# Patient Record
Sex: Female | Born: 1955 | Race: White | Hispanic: No | Marital: Married | Smoking: Former smoker
Health system: Southern US, Community
[De-identification: ages and names within clinical notes are randomized; demographics above are authoritative.]

## PROBLEM LIST (undated history)

## (undated) DIAGNOSIS — E782 Mixed hyperlipidemia: Secondary | ICD-10-CM

## (undated) DIAGNOSIS — R112 Nausea with vomiting, unspecified: Secondary | ICD-10-CM

## (undated) DIAGNOSIS — Z9889 Other specified postprocedural states: Secondary | ICD-10-CM

## (undated) HISTORY — PX: COSMETIC SURGERY: SHX468

## (undated) HISTORY — PX: MOUTH SURGERY: SHX715

---

## 1979-11-29 HISTORY — PX: SHOULDER SURGERY: SHX246

## 2005-10-27 ENCOUNTER — Ambulatory Visit: Payer: Self-pay | Admitting: General Practice

## 2005-11-03 ENCOUNTER — Ambulatory Visit: Payer: Self-pay | Admitting: Family Medicine

## 2006-06-21 ENCOUNTER — Ambulatory Visit: Payer: Self-pay | Admitting: Family Medicine

## 2006-07-06 ENCOUNTER — Ambulatory Visit: Payer: Self-pay | Admitting: Family Medicine

## 2008-09-15 ENCOUNTER — Ambulatory Visit: Payer: Self-pay | Admitting: Family Medicine

## 2008-10-10 ENCOUNTER — Ambulatory Visit: Payer: Self-pay | Admitting: Family Medicine

## 2009-03-06 ENCOUNTER — Ambulatory Visit: Payer: Self-pay | Admitting: Diagnostic Radiology

## 2009-03-09 ENCOUNTER — Ambulatory Visit: Payer: Self-pay | Admitting: Unknown Physician Specialty

## 2010-03-22 ENCOUNTER — Ambulatory Visit: Payer: Self-pay | Admitting: Unknown Physician Specialty

## 2011-02-02 ENCOUNTER — Ambulatory Visit: Payer: Self-pay | Admitting: Family Medicine

## 2013-02-19 ENCOUNTER — Ambulatory Visit: Payer: Self-pay | Admitting: Medical

## 2013-02-19 LAB — HM MAMMOGRAPHY

## 2013-02-21 ENCOUNTER — Ambulatory Visit: Payer: Self-pay | Admitting: Medical

## 2013-05-03 LAB — HM PAP SMEAR: HM Pap smear: NEGATIVE

## 2013-05-06 LAB — LIPID PANEL
CHOLESTEROL: 198 mg/dL (ref 0–200)
HDL: 55 mg/dL (ref 35–70)
LDL CALC: 132 mg/dL
TRIGLYCERIDES: 54 mg/dL (ref 40–160)

## 2013-05-06 LAB — TSH: TSH: 2.35 u[IU]/mL (ref 0.41–5.90)

## 2013-05-06 LAB — BASIC METABOLIC PANEL
BUN: 16 mg/dL (ref 4–21)
Creatinine: 0.8 mg/dL (ref 0.5–1.1)
Glucose: 91 mg/dL
Potassium: 4.9 mmol/L (ref 3.4–5.3)
SODIUM: 146 mmol/L (ref 137–147)

## 2013-05-06 LAB — CBC AND DIFFERENTIAL
HEMATOCRIT: 37 % (ref 36–46)
HEMOGLOBIN: 12.4 g/dL (ref 12.0–16.0)
PLATELETS: 272 10*3/uL (ref 150–399)
WBC: 3.8 10*3/mL

## 2013-05-06 LAB — HEPATIC FUNCTION PANEL
ALT: 18 U/L (ref 7–35)
AST: 23 U/L (ref 13–35)

## 2013-05-22 ENCOUNTER — Ambulatory Visit: Payer: Self-pay | Admitting: Family Medicine

## 2014-06-06 IMAGING — MG MAM DGT SCR W/CAD CORP NO ORDER
2 series · 5 of 5 positions shown · non-contrast
Comparison: none

REASON FOR EXAM: corp scr mammo no order
COMMENTS:

PROCEDURE:     MAM - MAM DGT SCR W/CAD CORP NO ORDER  - February 19, 2013  [DATE]
RESULT:     Bilateral digital screening mammogram with CAD dated 02/19/2013
Comparison study/studies dated: 02/02/2011, [DATE] [DATE], 06/21/2006

[R CC · right · 4 of 4 slices shown]
[im 1/4]
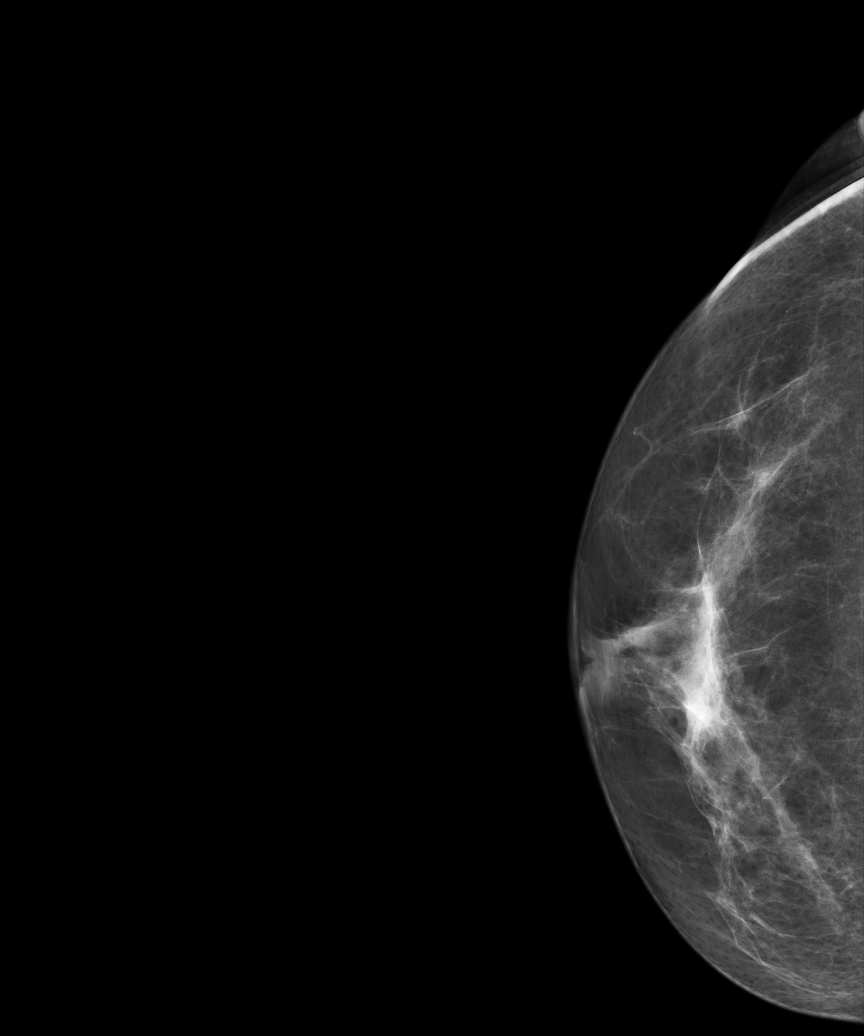
[im 2/4]
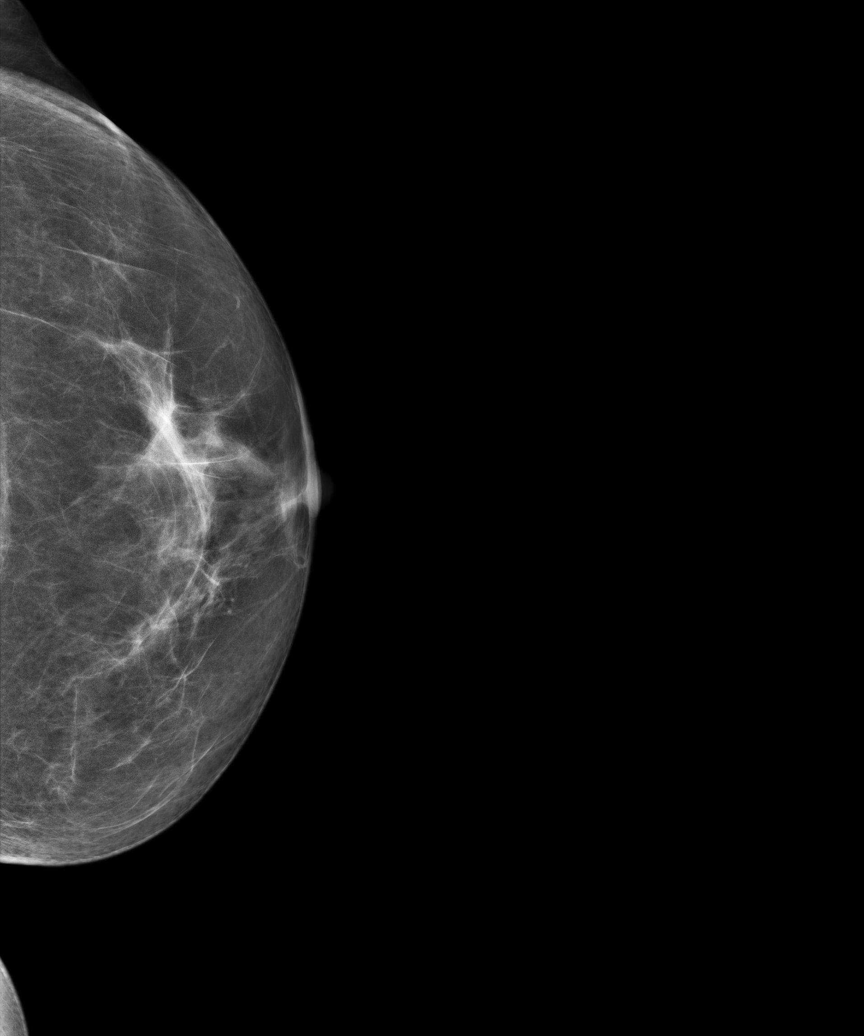
[im 3/4]
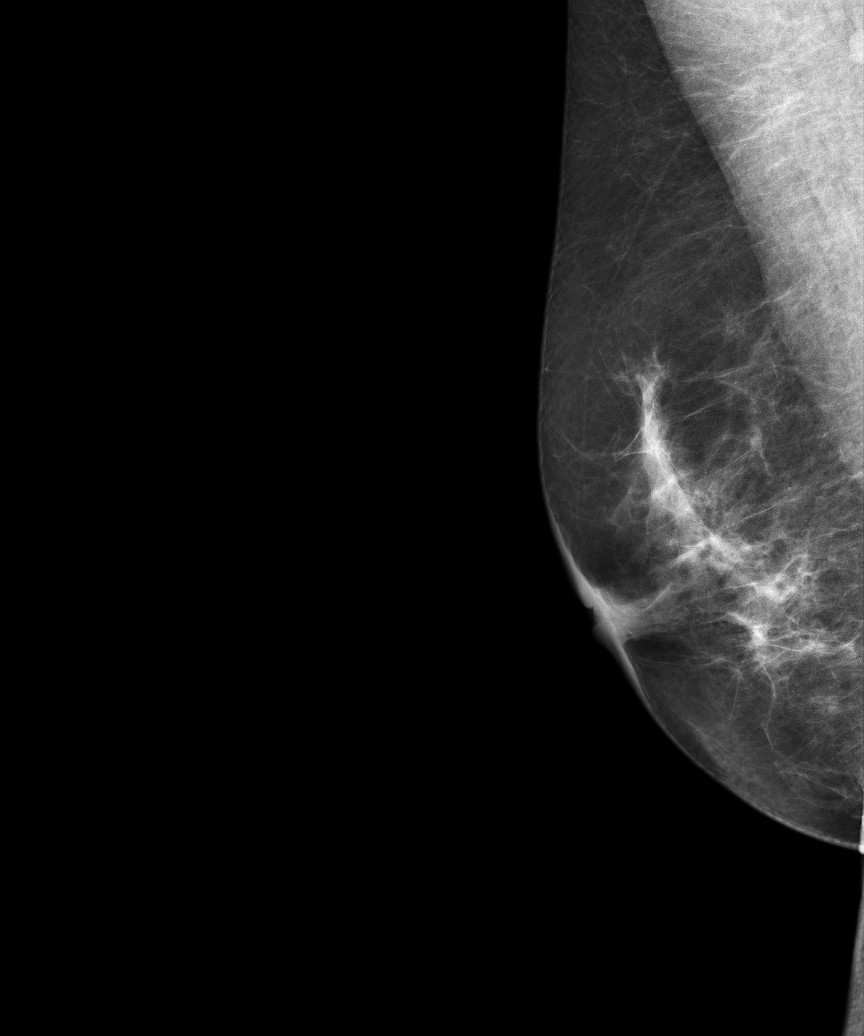
[im 4/4]
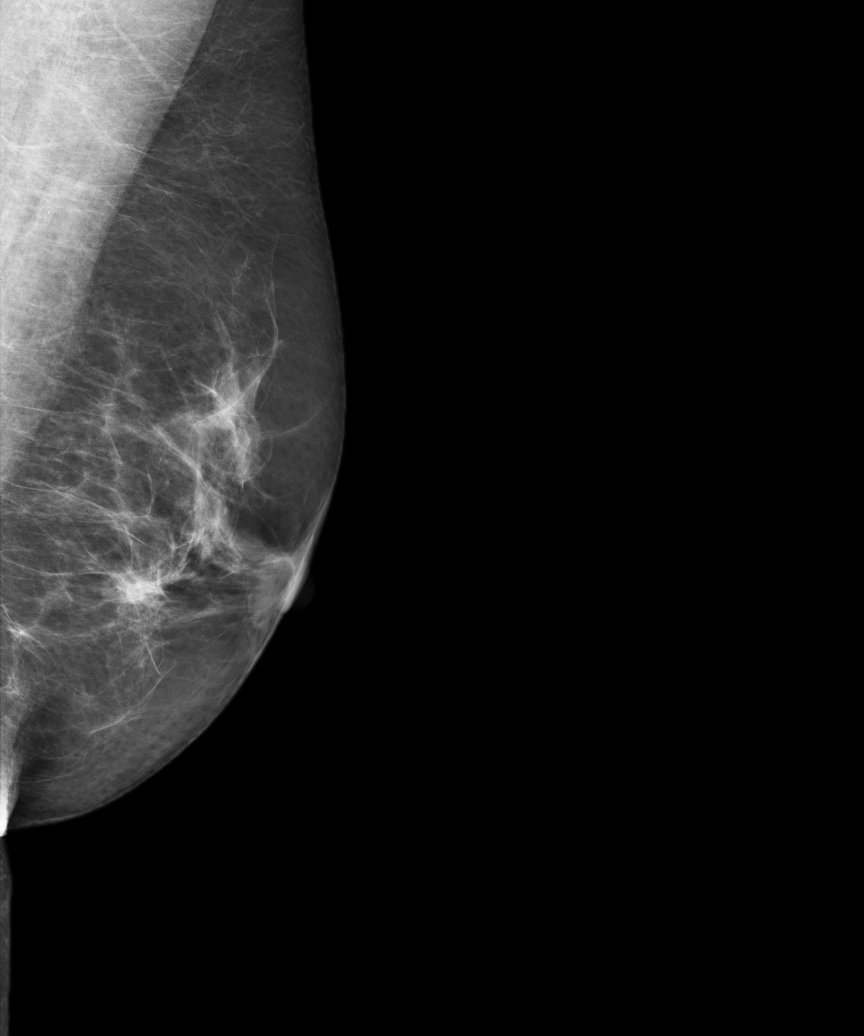

[L MLO]
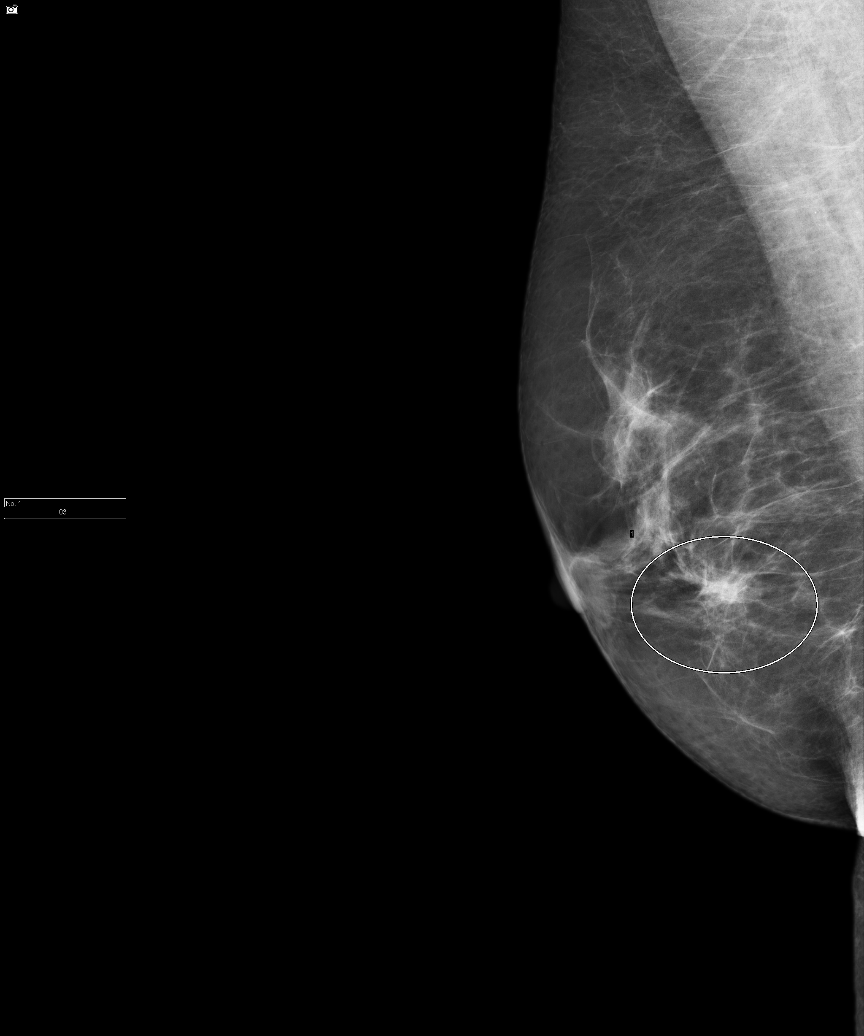

[5 of 5 positions shown; findings below may reference images not displayed]

FINDINGS: BREAST COMPOSITION: The breast composition is SCATTERED FIBROGLANDULAR
TISSUE (glandular tissue is 25-50%)

An area of asymmetric density projects in the inferior portion of the left
breast additional imaging recommended.

There is no further radiographic evidence to suggest malignancy.
IMPRESSION: BI-RADS: Category 0 - Need Additional Imaging Evaluation

A NEGATIVE MAMMOGRAM REPORT DOES NOT PRECLUDE BIOPSY OR OTHER EVALUATION OF
A CLINICALLY PALPABLE OR OTHERWISE SUSPICIOUS MASS OR LESION. BREAST CANCER
WADA NOT BE DETECTED IN UP TO 10% OF CASES.

## 2014-06-08 IMAGING — MG MAM DGTL ADD VW LT  SCR
1 series · 3 of 3 positions shown · non-contrast
Comparison: none

REASON FOR EXAM: DENSITY
COMMENTS:

PROCEDURE:     MAM - MAM DGTL ADD VW LT  SCR  - February 21, 2013  [DATE]
RESULT:     Additional views 02/21/2013

[L ML · left · 3 of 3 slices shown]
[im 1/3]
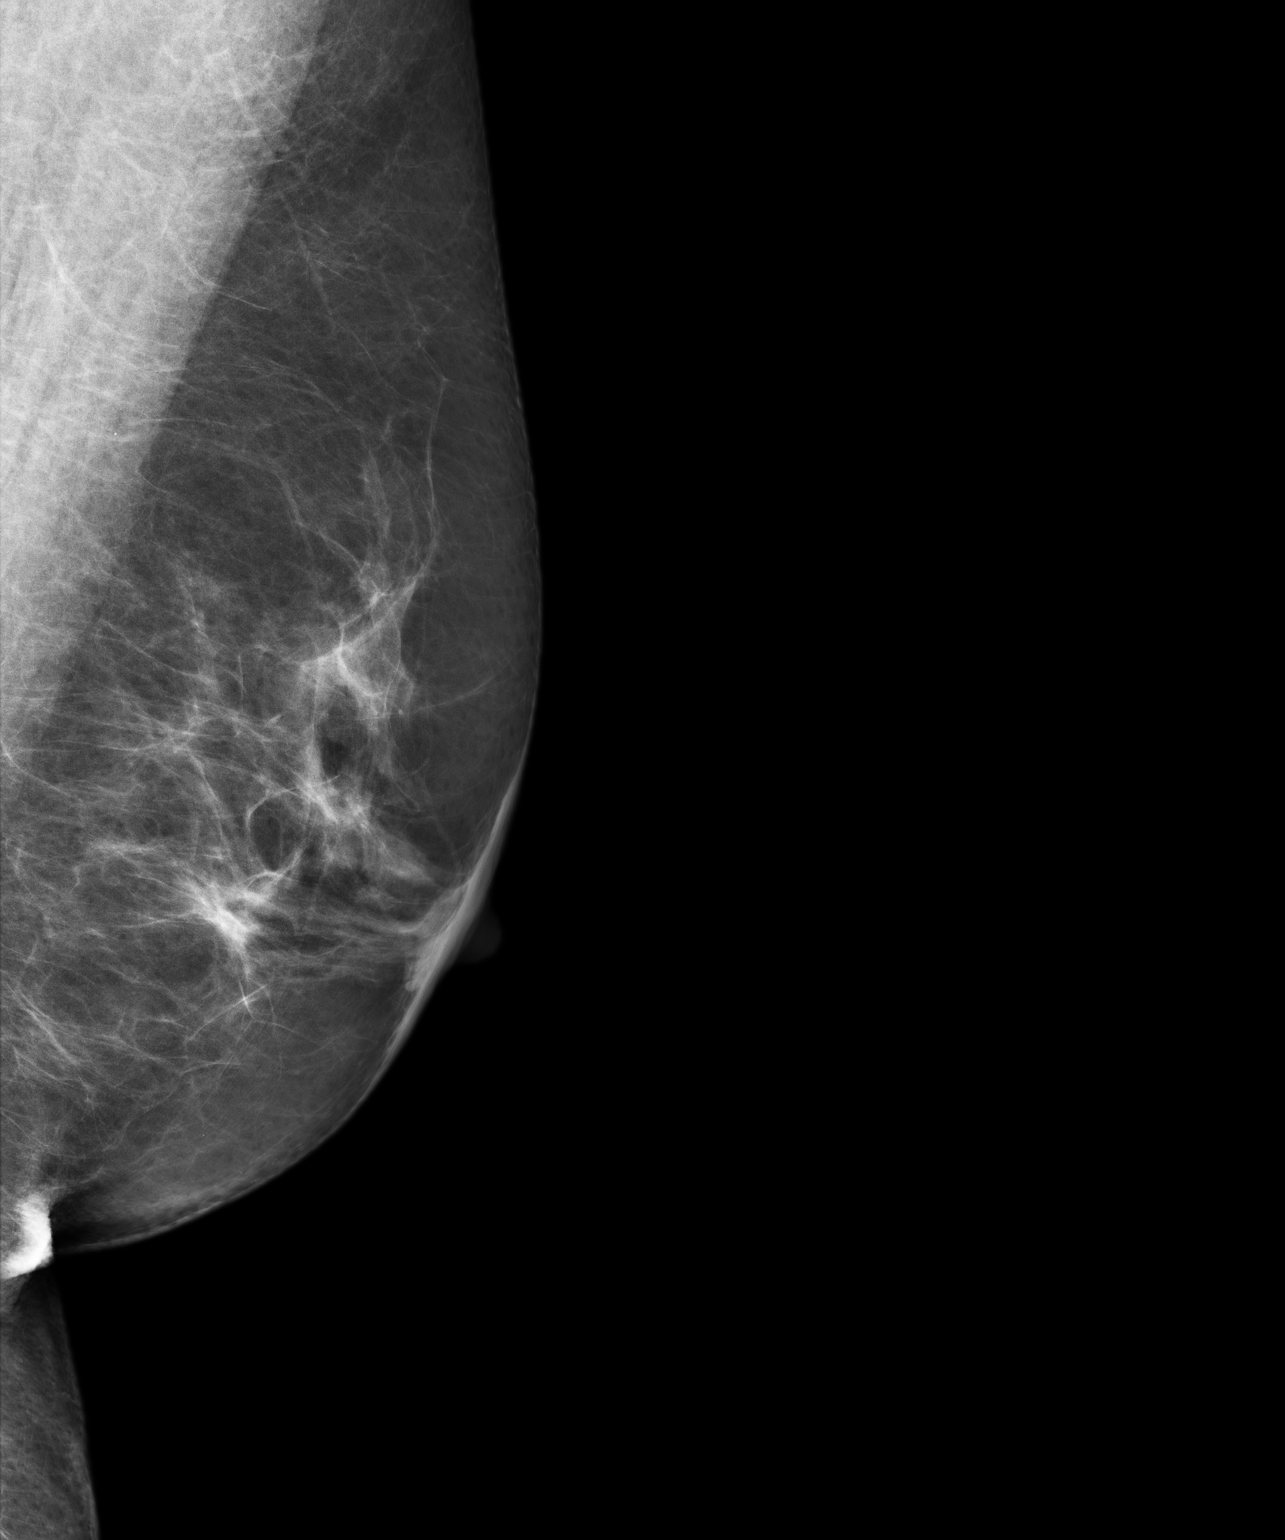
[im 2/3]
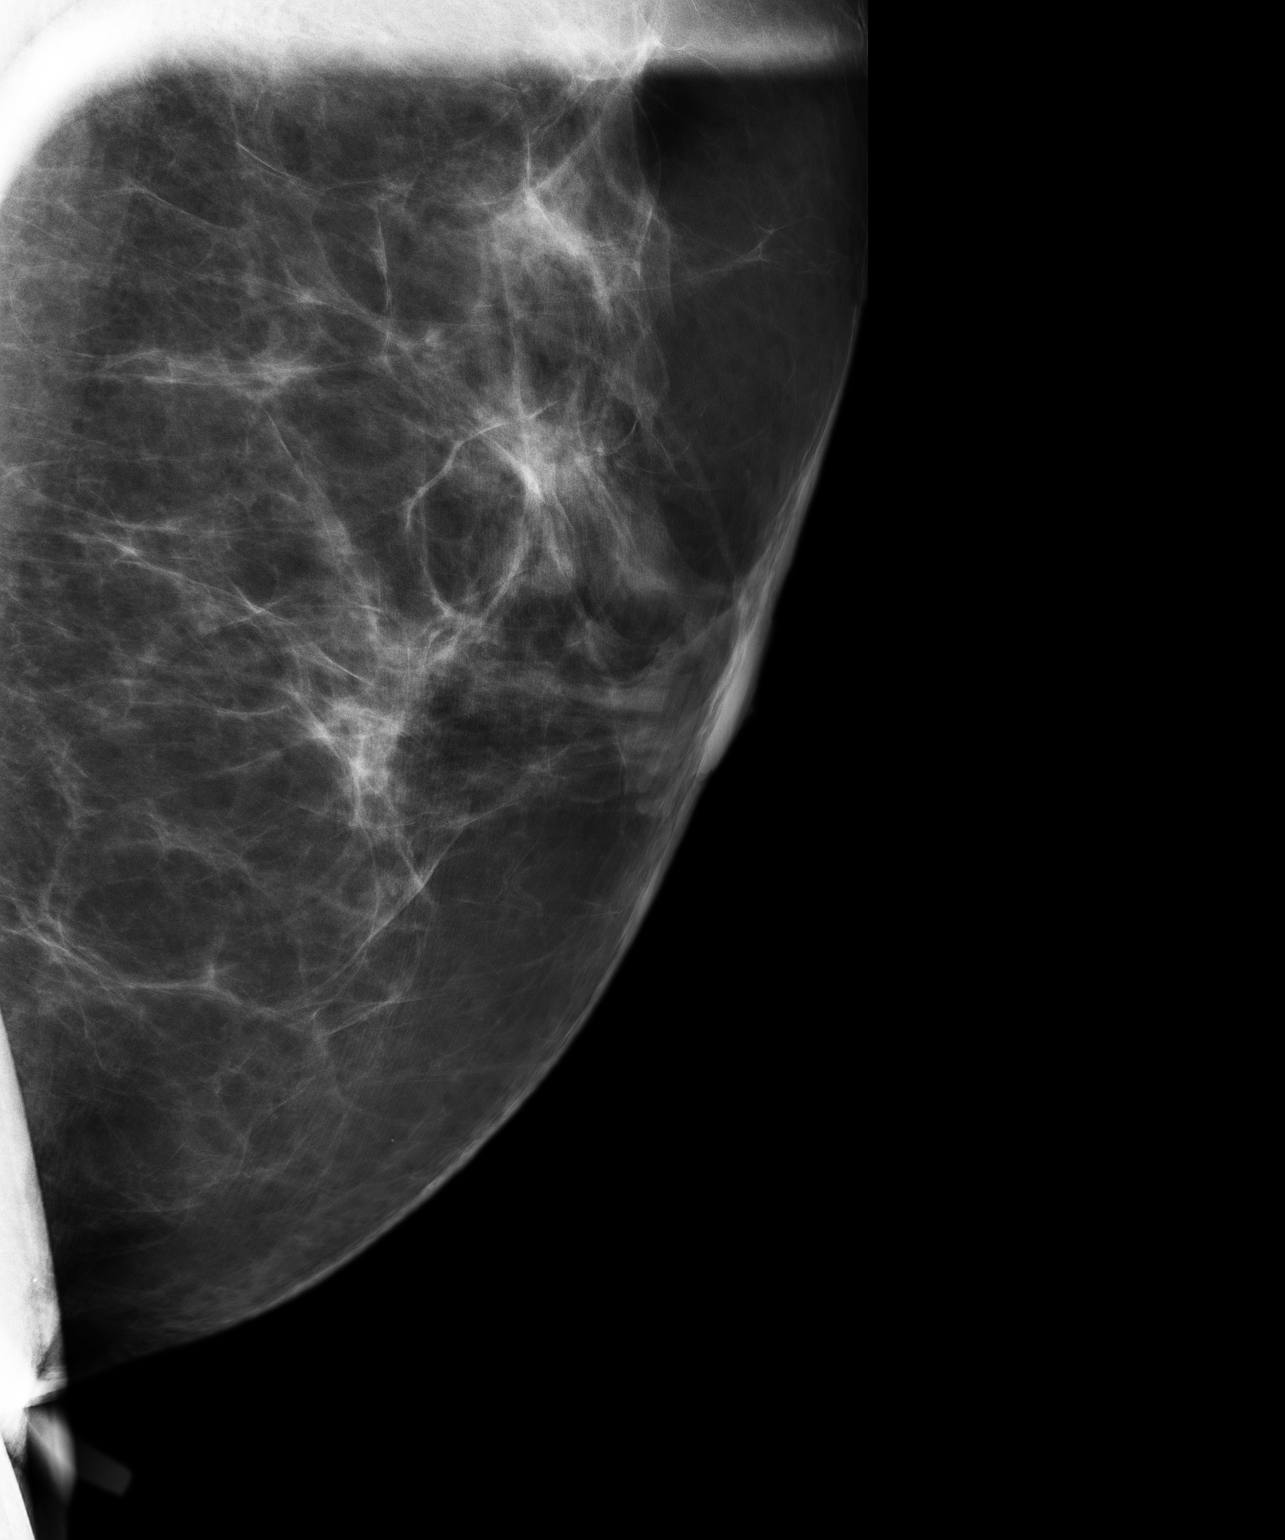
[im 3/3]
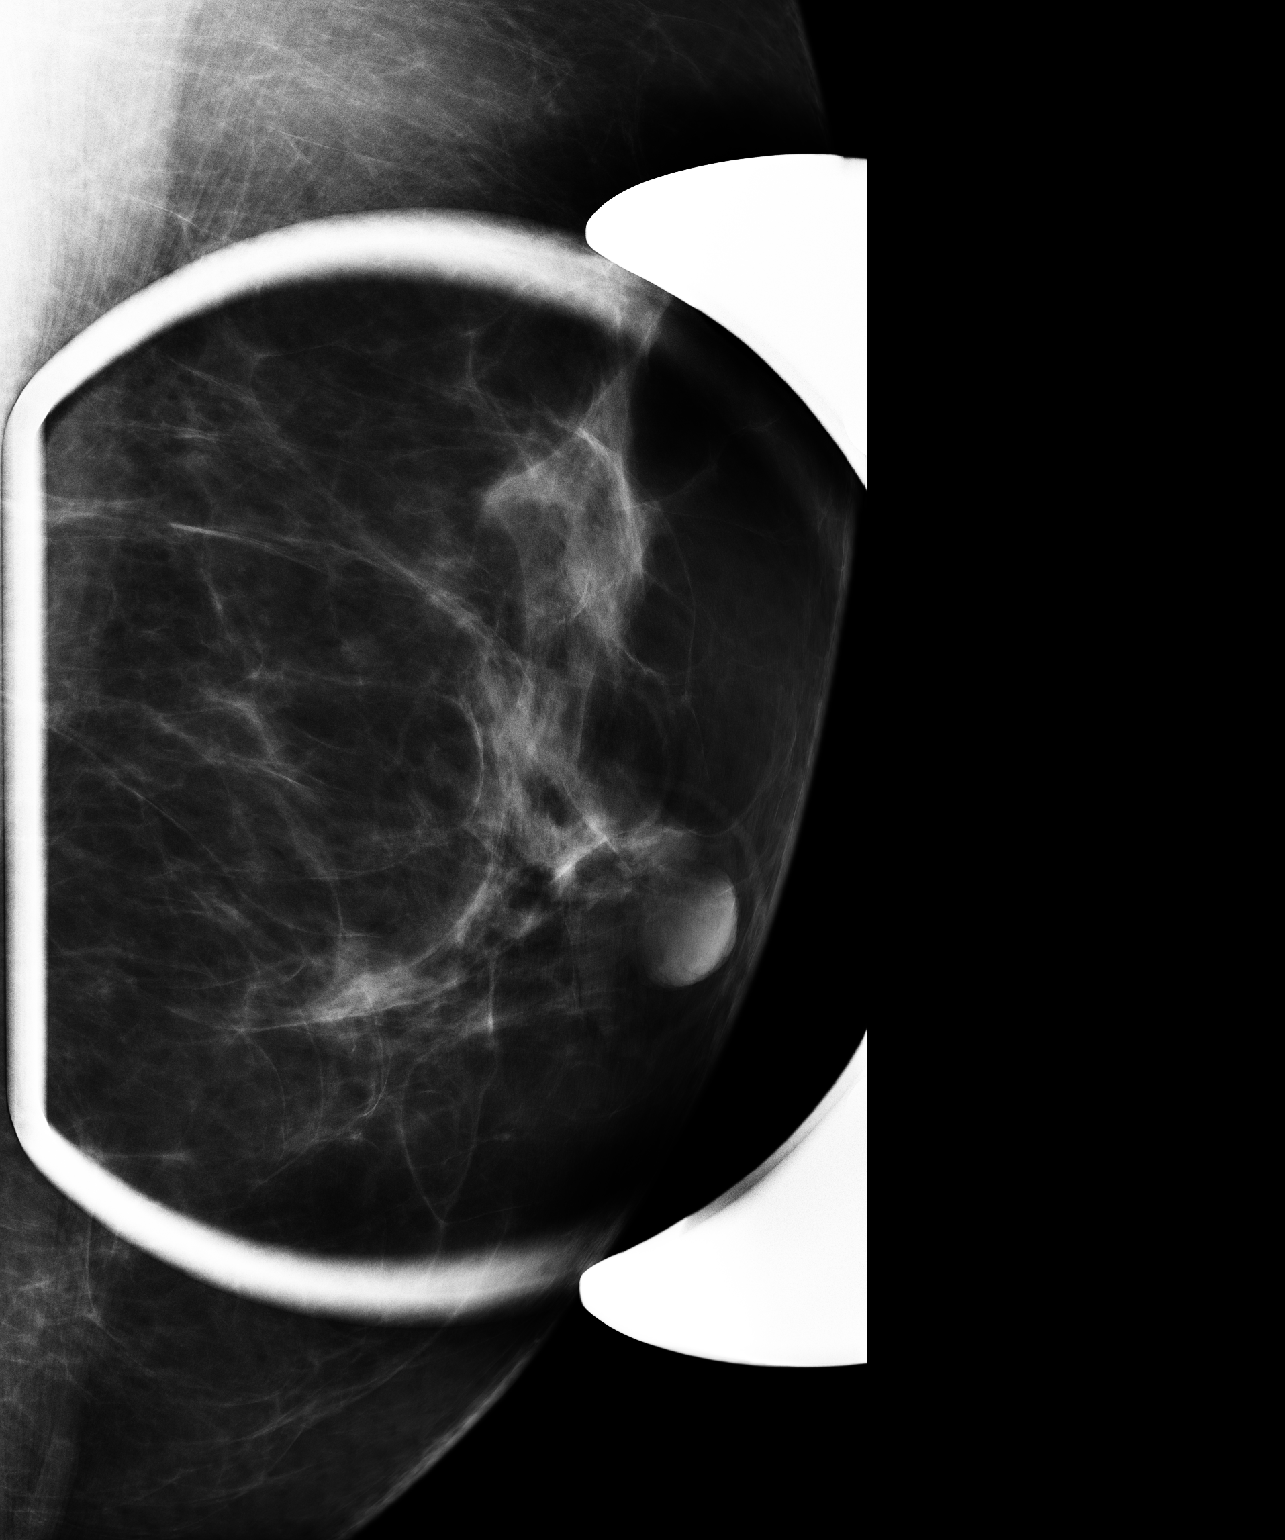

[3 of 3 positions shown; findings below may reference images not displayed]

FINDINGS: The previously described asymmetric density effaces with
compression.
IMPRESSION: BI-RADS: Category 2- Benign Finding

A NEGATIVE MAMMOGRAM REPORT DOES NOT PRECLUDE BIOPSY OR OTHER EVALUATION OF
A CLINICALLY PALPABLE OR OTHERWISE SUSPICIOUS MASS OR LESION. BREAST CANCER
MAY NOT BE DETECTED IN UP TO 10% OF CASES.

## 2015-09-29 ENCOUNTER — Encounter: Payer: Self-pay | Admitting: Family Medicine

## 2015-09-29 ENCOUNTER — Ambulatory Visit (INDEPENDENT_AMBULATORY_CARE_PROVIDER_SITE_OTHER): Payer: BLUE CROSS/BLUE SHIELD | Admitting: Family Medicine

## 2015-09-29 VITALS — BP 102/64 | HR 60 | Temp 97.9°F | Resp 16 | Wt 149.0 lb

## 2015-09-29 DIAGNOSIS — M545 Low back pain, unspecified: Secondary | ICD-10-CM

## 2015-09-29 DIAGNOSIS — E221 Hyperprolactinemia: Secondary | ICD-10-CM | POA: Insufficient documentation

## 2015-09-29 DIAGNOSIS — N912 Amenorrhea, unspecified: Secondary | ICD-10-CM | POA: Insufficient documentation

## 2015-09-29 MED ORDER — MELOXICAM 15 MG PO TABS: 15.0000 mg | ORAL_TABLET | Freq: Every day | ORAL | Status: DC

## 2015-09-29 MED ORDER — CYCLOBENZAPRINE HCL 5 MG PO TABS: 5.0000 mg | ORAL_TABLET | Freq: Three times a day (TID) | ORAL | Status: DC | PRN

## 2015-09-29 NOTE — Progress Notes (Signed)
Subjective:     Patient ID: Bridget Pollard, female   DOB: Mar 18, 1956, 59 y.o.   MRN: [id number]  Back Pain This is a chronic problem. The current episode started 1 to 4 weeks ago (Worsening in the last three weeks. Is very active.  Really made it worse  last. ). The problem occurs daily. The problem has been gradually worsening since onset. The pain is present in the lumbar spine. The quality of the pain is described as aching, stabbing and shooting. Radiates to: Left hip. The pain is at a severity of 1/10 (Has been as high as 7/10.  ). The pain is worse during the night (Pt has a hard time sleeping and will wake her up at night. ). The symptoms are aggravated by position, standing and sitting. Pertinent negatives include no abdominal pain, bladder incontinence, bowel incontinence, dysuria, headaches, leg pain, numbness, paresis, paresthesias, pelvic pain, tingling or weakness. Treatments tried: Naproxen; position change. The treatment provided moderate relief.     Patient Active Problem List   Diagnosis Date Noted  . Hyperprolactinemia (HCC) 09/29/2015  . Absence of menstruation 09/29/2015   Family History  Problem Relation Age of Onset  . COPD Mother   . Stroke Father   . Prostate cancer Father   . Heart attack Father   . Skin cancer Father   . Hypertension Sister   . Skin cancer Sister   . Diabetes Brother   . Heart disease Brother    Social History   Social History  . Marital Status: Married    Spouse Name: N/A  . Number of Children: 0  . Years of Education: PhD   Occupational History  . Full Time Teacher    Social History Main Topics  . Smoking status: Former Smoker    Quit date: 11/28/1979  . Smokeless tobacco: Never Used  . Alcohol Use: Yes     Comment: occasional  . Drug Use: No  . Sexual Activity: Not on file   Other Topics Concern  . Not on file   Social History Narrative   Past Surgical History  Procedure Laterality Date  . Shoulder surgery Left 1981  .  Mouth surgery    . Cosmetic surgery      Neck Lift   Allergies  Allergen Reactions  . Penicillins Hives   Previous Medications   VITAMIN E 400 UNIT CAPSULE    Take by mouth.   BP 102/64 mmHg  Pulse 60  Temp(Src) 97.9 F (36.6 C) (Oral)  Resp 16  Wt 149 lb (67.586 kg)  Review of Systems  Constitutional: Negative.   Cardiovascular: Negative.   Gastrointestinal: Positive for constipation (Chronic issue). Negative for nausea, vomiting, abdominal pain, diarrhea, blood in stool, abdominal distention, anal bleeding, rectal pain and bowel incontinence.  Genitourinary: Negative for bladder incontinence, dysuria, urgency, frequency, hematuria, vaginal bleeding and pelvic pain.  Musculoskeletal: Positive for back pain and arthralgias (Left hip). Negative for myalgias, joint swelling, gait problem, neck pain and neck stiffness.  Neurological: Negative for dizziness, tingling, weakness, light-headedness, numbness, headaches and paresthesias.       Objective:   Physical Exam  Constitutional: She is oriented to person, place, and time. She appears well-developed and well-nourished.  Cardiovascular: Normal rate and regular rhythm.   Pulmonary/Chest: Effort normal and breath sounds normal.  Musculoskeletal: Normal range of motion. She exhibits no edema or tenderness.  Neurological: She is alert and oriented to person, place, and time. She has normal reflexes. Coordination normal.  Psychiatric: She has a normal mood and affect. Her behavior is normal. Judgment and thought content normal.   BP 102/64 mmHg  Pulse 60  Temp(Src) 97.9 F (36.6 C) (Oral)  Resp 16  Wt 149 lb (67.586 kg)      Assessment:     Low back strain.     Plan:     1. Bilateral low back pain without sciatica Exam normal today, but does spasm at times.  Will rest and treat with medication as below. Will start exercises when improved.   - meloxicam (MOBIC) 15 MG tablet; Take 1 tablet (15 mg total) by mouth daily.   Dispense: 30 tablet; Refill: 0 - cyclobenzaprine (FLEXERIL) 5 MG tablet; Take 1 tablet (5 mg total) by mouth 3 (three) times daily as needed for muscle spasms.  Dispense: 30 tablet; Refill: 0     Lorie PhenixNancy Delberta Folts, MD

## 2015-12-30 ENCOUNTER — Encounter: Payer: BLUE CROSS/BLUE SHIELD | Admitting: Family Medicine

## 2016-01-06 ENCOUNTER — Ambulatory Visit (INDEPENDENT_AMBULATORY_CARE_PROVIDER_SITE_OTHER): Payer: BLUE CROSS/BLUE SHIELD | Admitting: Family Medicine

## 2016-01-06 ENCOUNTER — Ambulatory Visit
Admission: RE | Admit: 2016-01-06 | Discharge: 2016-01-06 | Disposition: A | Discharge status: Home / Self Care | Payer: BLUE CROSS/BLUE SHIELD | Source: Ambulatory Visit | Attending: Family Medicine | Admitting: Family Medicine

## 2016-01-06 ENCOUNTER — Encounter: Payer: Self-pay | Admitting: Family Medicine

## 2016-01-06 ENCOUNTER — Telehealth: Payer: Self-pay

## 2016-01-06 VITALS — BP 118/60 | HR 98 | Temp 99.2°F | Resp 16 | Wt 150.0 lb

## 2016-01-06 DIAGNOSIS — R05 Cough: Secondary | ICD-10-CM

## 2016-01-06 DIAGNOSIS — J40 Bronchitis, not specified as acute or chronic: Secondary | ICD-10-CM | POA: Diagnosis not present

## 2016-01-06 DIAGNOSIS — R059 Cough, unspecified: Secondary | ICD-10-CM

## 2016-01-06 MED ORDER — AZITHROMYCIN 250 MG PO TABS: ORAL_TABLET | ORAL | Status: DC

## 2016-01-06 NOTE — Telephone Encounter (Signed)
-----   Message from [name], MD sent at 01/06/2016  1:25 PM EST ----- Normal CXR.  Please notify patient. Thanks.

## 2016-01-06 NOTE — Telephone Encounter (Signed)
LMTCB Emily Drozdowski, CMA  

## 2016-01-06 NOTE — Progress Notes (Signed)
Subjective:    Patient ID: Bridget Pollard, female    DOB: Jan 01, 1956, 60 y.o.   MRN: [id number]  URI  The current episode started in the past 7 days (has had cough since before Christmas, cough improved but returned on Sunday). The problem has been gradually worsening. Maximum temperature: has temperature of 99.2 in office, c/o chills. Associated symptoms include chest pain (ribs), congestion, coughing (mostly dry), nausea, neck pain, a plugged ear sensation, rhinorrhea, a sore throat and wheezing. Pertinent negatives include no abdominal pain, diarrhea, dysuria, ear pain, headaches, sinus pain, sneezing, swollen glands or vomiting. She has tried nothing for the symptoms.      Review of Systems  Constitutional: Positive for chills.  HENT: Positive for congestion, rhinorrhea and sore throat. Negative for ear pain and sneezing.   Respiratory: Positive for cough (mostly dry) and wheezing.   Cardiovascular: Positive for chest pain (ribs).  Gastrointestinal: Positive for nausea. Negative for vomiting, abdominal pain and diarrhea.  Genitourinary: Negative for dysuria.  Musculoskeletal: Positive for neck pain.  Neurological: Negative for headaches.   BP 118/60 mmHg  Pulse 98  Temp(Src) 99.2 F (37.3 C) (Oral)  Resp 16  Wt 150 lb (68.04 kg)  SpO2 96%   Patient Active Problem List   Diagnosis Date Noted  . Hyperprolactinemia (HCC) 09/29/2015  . Absence of menstruation 09/29/2015   No past medical history on file. Current Outpatient Prescriptions on File Prior to Visit  Medication Sig  . vitamin E 400 UNIT capsule Take by mouth.   No current facility-administered medications on file prior to visit.   Allergies  Allergen Reactions  . Penicillins Hives   Past Surgical History  Procedure Laterality Date  . Shoulder surgery Left 1981  . Mouth surgery    . Cosmetic surgery      Neck Lift   Social History   Social History  . Marital Status: Married    Spouse Name: N/A  .  Number of Children: 0  . Years of Education: PhD   Occupational History  . Full Time Teacher    Social History Main Topics  . Smoking status: Former Smoker    Quit date: 11/28/1979  . Smokeless tobacco: Never Used  . Alcohol Use: Yes     Comment: occasional  . Drug Use: No  . Sexual Activity: Not on file   Other Topics Concern  . Not on file   Social History Narrative   Family History  Problem Relation Age of Onset  . COPD Mother   . Stroke Father   . Prostate cancer Father   . Heart attack Father   . Skin cancer Father   . Hypertension Sister   . Skin cancer Sister   . Diabetes Brother   . Heart disease Brother       Objective:   Physical Exam  Constitutional: She appears well-developed and well-nourished.  HENT:  Right Ear: Tympanic membrane normal.  Left Ear: Tympanic membrane normal.  Mouth/Throat: Oropharynx is clear and moist.  Cardiovascular: Normal rate, regular rhythm and normal heart sounds.   Pulmonary/Chest: Effort normal and breath sounds normal. No respiratory distress.  Psychiatric: She has a normal mood and affect. Her behavior is normal.   BP 118/60 mmHg  Pulse 98  Temp(Src) 99.2 F (37.3 C) (Oral)  Resp 16  Wt 150 lb (68.04 kg)  SpO2 96%     Assessment & Plan:  1. Cough Recurrent. Check CXR.   - DG Chest 2 View; Future  2. Bronchitis New problem. Worsening.  Will try Zpak.  Consider Claritin and Singulair if does not improved.  - azithromycin (ZITHROMAX) 250 MG tablet; 2 tablets on the first day, and 1 tablet for each remaining day until gone  Dispense: 6 each; Refill: 0  Patient was seen and examined by Leo Grosser, MD, and note scribed by Allene Dillon, CMA. I have reviewed the document for accuracy and completeness and I agree with above. Leo Grosser, MD   [name], MD

## 2016-01-06 NOTE — Addendum Note (Signed)
Addended by: Leo Grosser on: 01/06/2016 12:36 PM   Modules accepted: Orders

## 2016-01-06 NOTE — Telephone Encounter (Signed)
Informed pt as below. Bridget Pollard, CMA  

## 2016-01-06 NOTE — Addendum Note (Signed)
Addended by: Tomasita Crumble on: 01/06/2016 12:20 PM   Modules accepted: Orders

## 2016-01-06 NOTE — Addendum Note (Signed)
Addended by: Tomasita Crumble on: 01/06/2016 12:34 PM   Modules accepted: Orders

## 2016-01-22 ENCOUNTER — Ambulatory Visit (INDEPENDENT_AMBULATORY_CARE_PROVIDER_SITE_OTHER): Payer: BLUE CROSS/BLUE SHIELD | Admitting: Family Medicine

## 2016-01-22 ENCOUNTER — Encounter: Payer: Self-pay | Admitting: Family Medicine

## 2016-01-22 VITALS — BP 102/60 | HR 82 | Temp 98.4°F | Resp 16 | Ht 63.0 in | Wt 150.0 lb

## 2016-01-22 DIAGNOSIS — Z0000 Encounter for general adult medical examination without abnormal findings: Secondary | ICD-10-CM | POA: Diagnosis not present

## 2016-01-22 DIAGNOSIS — E221 Hyperprolactinemia: Secondary | ICD-10-CM

## 2016-01-22 DIAGNOSIS — Z1211 Encounter for screening for malignant neoplasm of colon: Secondary | ICD-10-CM | POA: Diagnosis not present

## 2016-01-22 DIAGNOSIS — E870 Hyperosmolality and hypernatremia: Secondary | ICD-10-CM | POA: Diagnosis not present

## 2016-01-22 DIAGNOSIS — Z1239 Encounter for other screening for malignant neoplasm of breast: Secondary | ICD-10-CM

## 2016-01-22 DIAGNOSIS — E7800 Pure hypercholesterolemia, unspecified: Secondary | ICD-10-CM

## 2016-01-22 NOTE — Progress Notes (Signed)
Patient ID: Bridget Pollard, female   DOB: 1956-05-05, 60 y.o.   MRN: [id number]       Patient: Bridget Pollard, Female    DOB: 03/17/1956, 60 y.o.   MRN: [id number] Visit Date: 01/22/2016  Today's Provider: [name], MD   Chief Complaint  Patient presents with  . Annual Exam   Subjective:    Annual physical exam Bridget Pollard is a 60 y.o. female who presents today for health maintenance and complete physical. She feels well. She reports exercising 5 days a week. She reports she is sleeping well. 05/03/13 CPE 05/03/13 Pap-neg; HPV-neg 02/19/13 Mammo-BI-RADS 2 04/17/99 BMD-normal -----------------------------------------------------------------   Review of Systems  Constitutional: Negative.   HENT: Negative.   Eyes: Positive for photophobia.  Respiratory: Positive for cough.   Cardiovascular: Negative.   Gastrointestinal: Negative.   Endocrine: Negative.   Genitourinary: Negative.   Musculoskeletal: Positive for back pain and arthralgias.  Skin: Negative.   Allergic/Immunologic: Negative.   Neurological: Negative.   Hematological: Negative.   Psychiatric/Behavioral: Negative.     Social History      She  reports that she quit smoking about 36 years ago. She has never used smokeless tobacco. She reports that she drinks about 1.2 oz of alcohol per week. She reports that she does not use illicit drugs.       Social History   Social History  . Marital Status: Married    Spouse Name: N/A  . Number of Children: 0  . Years of Education: PhD   Occupational History  . Full Time Teacher    Social History Main Topics  . Smoking status: Former Smoker    Quit date: 11/28/1979  . Smokeless tobacco: Never Used  . Alcohol Use: 1.2 oz/week    2 Glasses of wine per week     Comment: occasional  . Drug Use: No  . Sexual Activity: Not Asked   Other Topics Concern  . None   Social History Narrative    History reviewed. No pertinent past medical history.   Patient  Active Problem List   Diagnosis Date Noted  . Hyperprolactinemia (HCC) 09/29/2015  . Absence of menstruation 09/29/2015    Past Surgical History  Procedure Laterality Date  . Shoulder surgery Left 1981  . Mouth surgery    . Cosmetic surgery      Neck Lift    Family History        Family Status  Relation Status Death Age  . Mother Deceased 53  . Father Deceased 21    MI  . Sister Alive   . Brother Alive   . Brother Deceased 96 months old    atrial septal defect        Her family history includes COPD in her mother; Diabetes in her brother; Heart attack in her father; Heart disease in her brother; Hypertension in her sister; Prostate cancer in her father; Skin cancer in her father and sister; Stroke in her father.    Allergies  Allergen Reactions  . Penicillins Hives    Previous Medications   MULTIPLE VITAMIN (MULTI VITAMIN PO)    Take 1 tablet by mouth daily.   VITAMIN E 400 UNIT CAPSULE    Take by mouth.    Patient Care Team: [name], MD as PCP - General (Family Medicine)     Objective:   Vitals: BP 102/60 mmHg  Pulse 82  Temp(Src) 98.4 F (36.9 C) (Oral)  Resp 16  Ht 5'  3" (1.6 m)  Wt 150 lb (68.04 kg)  BMI 26.58 kg/m2  SpO2 97%   Physical Exam  Constitutional: She is oriented to person, place, and time. She appears well-developed and well-nourished.  HENT:  Head: Normocephalic and atraumatic.  Right Ear: Tympanic membrane, external ear and ear canal normal.  Left Ear: Tympanic membrane, external ear and ear canal normal.  Nose: Nose normal.  Mouth/Throat: Uvula is midline, oropharynx is clear and moist and mucous membranes are normal.  Eyes: Conjunctivae, EOM and lids are normal. Pupils are equal, round, and reactive to light.  Neck: Trachea normal and normal range of motion. Neck supple. Carotid bruit is not present. No thyroid mass and no thyromegaly present.  Cardiovascular: Normal rate, regular rhythm and normal heart sounds.     Pulmonary/Chest: Effort normal and breath sounds normal. Right breast exhibits inverted nipple.  Abdominal: Soft. Normal appearance and bowel sounds are normal. There is no hepatosplenomegaly. There is no tenderness.  Genitourinary: No breast swelling, tenderness or discharge.  Musculoskeletal: Normal range of motion.  Lymphadenopathy:    She has no cervical adenopathy.    She has no axillary adenopathy.  Neurological: She is alert and oriented to person, place, and time. She has normal strength. No cranial nerve deficit.  Skin: Skin is warm, dry and intact.  Psychiatric: She has a normal mood and affect. Her speech is normal and behavior is normal. Judgment and thought content normal. Cognition and memory are normal.     Assessment & Plan:     Routine Health Maintenance and Physical Exam  Exercise Activities and Dietary recommendations Goals    None      Immunization History  Administered Date(s) Administered  . Td 12/16/2005   1. Annual physical exam Eat healthy and exercise.  Obtain copy of vaccines from work for our chart.   2. Breast cancer screening Will schedule.  - MM Digital Diagnostic Bilat; Future  3. Colon cancer screening Will schedule.  - Ambulatory referral to Gastroenterology  4. Hyperprolactinemia (HCC) Presumed stable. Will check labs.  - Prolactin  5. Hypernatremia Recheck labs.  - Comprehensive metabolic panel  6. Elevated LDL cholesterol level Will check labs.  - CBC with Differential/Platelet - Lipid Panel With LDL/HDL Ratio  Patient was seen and examined by Leo Grosser, MD, and note scribed by Rondel Baton, CMA.   I have reviewed the document for accuracy and completeness and I agree with above. Leo Grosser, MD   [name], MD     --------------------------------------------------------------------

## 2016-01-22 NOTE — Patient Instructions (Signed)
Please call the [facility] at [facility] to schedule this at [phone]   

## 2016-01-27 LAB — CBC WITH DIFFERENTIAL/PLATELET
Basophils Absolute: 0 10*3/uL (ref 0.0–0.2)
Basos: 0 %
EOS (ABSOLUTE): 0.1 10*3/uL (ref 0.0–0.4)
EOS: 2 %
HEMATOCRIT: 38.2 % (ref 34.0–46.6)
HEMOGLOBIN: 12.6 g/dL (ref 11.1–15.9)
IMMATURE GRANS (ABS): 0 10*3/uL (ref 0.0–0.1)
IMMATURE GRANULOCYTES: 0 %
LYMPHS: 31 %
Lymphocytes Absolute: 1.6 10*3/uL (ref 0.7–3.1)
MCH: 30.9 pg (ref 26.6–33.0)
MCHC: 33 g/dL (ref 31.5–35.7)
MCV: 94 fL (ref 79–97)
MONOCYTES: 11 %
Monocytes Absolute: 0.6 10*3/uL (ref 0.1–0.9)
NEUTROS PCT: 56 %
Neutrophils Absolute: 3 10*3/uL (ref 1.4–7.0)
Platelets: 311 10*3/uL (ref 150–379)
RBC: 4.08 x10E6/uL (ref 3.77–5.28)
RDW: 12.7 % (ref 12.3–15.4)
WBC: 5.3 10*3/uL (ref 3.4–10.8)

## 2016-01-27 LAB — COMPREHENSIVE METABOLIC PANEL
ALBUMIN: 4.4 g/dL (ref 3.5–5.5)
ALT: 15 IU/L (ref 0–32)
AST: 20 IU/L (ref 0–40)
Albumin/Globulin Ratio: 1.8 (ref 1.1–2.5)
Alkaline Phosphatase: 127 IU/L — ABNORMAL HIGH (ref 39–117)
BUN / CREAT RATIO: 14 (ref 9–23)
BUN: 11 mg/dL (ref 6–24)
Bilirubin Total: 0.3 mg/dL (ref 0.0–1.2)
CALCIUM: 9.6 mg/dL (ref 8.7–10.2)
CO2: 25 mmol/L (ref 18–29)
CREATININE: 0.77 mg/dL (ref 0.57–1.00)
Chloride: 103 mmol/L (ref 96–106)
GFR calc Af Amer: 98 mL/min/{1.73_m2} (ref >59)
GFR, EST NON AFRICAN AMERICAN: 85 mL/min/{1.73_m2} (ref >59)
GLOBULIN, TOTAL: 2.4 g/dL (ref 1.5–4.5)
GLUCOSE: 93 mg/dL (ref 65–99)
Potassium: 5.1 mmol/L (ref 3.5–5.2)
SODIUM: 143 mmol/L (ref 134–144)
Total Protein: 6.8 g/dL (ref 6.0–8.5)

## 2016-01-27 LAB — LIPID PANEL WITH LDL/HDL RATIO
Cholesterol, Total: 211 mg/dL — ABNORMAL HIGH (ref 100–199)
HDL: 46 mg/dL (ref >39)
LDL CALC: 144 mg/dL — AB (ref 0–99)
LDL/HDL RATIO: 3.1 ratio (ref 0.0–3.2)
TRIGLYCERIDES: 105 mg/dL (ref 0–149)
VLDL CHOLESTEROL CAL: 21 mg/dL (ref 5–40)

## 2016-01-27 LAB — PROLACTIN: Prolactin: 98.9 ng/mL — ABNORMAL HIGH (ref 4.8–23.3)

## 2016-01-29 ENCOUNTER — Telehealth: Payer: Self-pay

## 2016-01-29 ENCOUNTER — Telehealth: Payer: Self-pay | Admitting: Family Medicine

## 2016-01-29 DIAGNOSIS — Z1231 Encounter for screening mammogram for malignant neoplasm of breast: Secondary | ICD-10-CM

## 2016-01-29 NOTE — Telephone Encounter (Signed)
Please change. Thanks. 

## 2016-01-29 NOTE — Telephone Encounter (Signed)
Left message to call back  

## 2016-01-29 NOTE — Telephone Encounter (Signed)
-----   Message from Lorie PhenixNancy Maloney, MD sent at 01/29/2016 12:24 PM EST ----- Labs normal except prolactin is higher than previous at 98. Know she had normal MRI several years ago and intolerant to medication previously.  Please see if wants to try another medication or follow clinically and repeat in 3 to 6 months. Could not find if it had been this high previously. Not scary high, just higher than last which was in the 40s.  Thanks.

## 2016-01-29 NOTE — Telephone Encounter (Signed)
There is an order in EPIC for diagnostic mammogram.Is this correct ? If so diagnosis would need to be changed

## 2016-02-01 NOTE — Telephone Encounter (Signed)
Pt is returning call.  CB#[phone]/MW

## 2016-03-03 ENCOUNTER — Ambulatory Visit
Admission: RE | Admit: 2016-03-03 | Discharge: 2016-03-03 | Disposition: A | Discharge status: Home / Self Care | Payer: BLUE CROSS/BLUE SHIELD | Source: Ambulatory Visit | Attending: Family Medicine | Admitting: Family Medicine

## 2016-03-03 DIAGNOSIS — Z1231 Encounter for screening mammogram for malignant neoplasm of breast: Secondary | ICD-10-CM | POA: Diagnosis not present

## 2016-07-15 ENCOUNTER — Encounter: Payer: Self-pay | Admitting: Physician Assistant

## 2016-07-15 ENCOUNTER — Ambulatory Visit (INDEPENDENT_AMBULATORY_CARE_PROVIDER_SITE_OTHER): Payer: BLUE CROSS/BLUE SHIELD | Admitting: Physician Assistant

## 2016-07-15 VITALS — BP 92/60 | HR 82 | Temp 98.3°F | Resp 16 | Ht 63.0 in | Wt 150.0 lb

## 2016-07-15 DIAGNOSIS — Z0189 Encounter for other specified special examinations: Secondary | ICD-10-CM | POA: Diagnosis not present

## 2016-07-15 DIAGNOSIS — Z008 Encounter for other general examination: Secondary | ICD-10-CM

## 2016-07-15 NOTE — Progress Notes (Signed)
Patient: Bridget PhillipsCarol A Vent Female    DOB: 05-23-1956   60 y.o.   MRN: [id number] Visit Date: 07/15/2016  Today's Provider: Margaretann LovelessJennifer M Derrisha Foos, PA-C   Chief Complaint  Patient presents with  . Form to be fill out   Subjective:    HPI Patient is just here for a form to be filled out for [employer]. She had her complete physical January 22, 2016 with Dr. Elease HashimotoMaloney. She has no complaints today and feels well.     Allergies  Allergen Reactions  . Penicillins Hives   Current Meds  Medication Sig  . Multiple Vitamin (MULTI VITAMIN PO) Take 1 tablet by mouth daily.  . vitamin E 400 UNIT capsule Take by mouth.    Review of Systems  Constitutional: Negative.   HENT: Negative.   Eyes: Negative.   Respiratory: Negative.   Cardiovascular: Negative.   Gastrointestinal: Negative.   Endocrine: Negative.   Genitourinary: Negative.   Musculoskeletal: Negative.   Skin: Negative.   Allergic/Immunologic: Negative.   Neurological: Negative.   Hematological: Negative.   Psychiatric/Behavioral: Negative.     Social History  Substance Use Topics  . Smoking status: Former Smoker    Quit date: 11/28/1979  . Smokeless tobacco: Never Used  . Alcohol use 1.2 oz/week    2 Glasses of wine per week     Comment: occasional   Objective:   BP 92/60 (BP Location: Right Arm, Patient Position: Sitting, Cuff Size: Normal)   Pulse 82   Temp 98.3 F (36.8 C) (Oral)   Resp 16   Ht 5\' 3"  (1.6 m)   Wt 150 lb (68 kg)   BMI 26.57 kg/m   Physical Exam  Constitutional: She is oriented to person, place, and time. She appears well-developed and well-nourished. No distress.  HENT:  Head: Normocephalic and atraumatic.  Right Ear: Tympanic membrane, external ear and ear canal normal.  Left Ear: Tympanic membrane, external ear and ear canal normal.  Nose: Nose normal.  Mouth/Throat: Uvula is midline, oropharynx is clear and moist and mucous membranes are normal. No oropharyngeal exudate.    Eyes: Conjunctivae and EOM are normal. Pupils are equal, round, and reactive to light. Right eye exhibits no discharge. Left eye exhibits no discharge. No scleral icterus.  Neck: Normal range of motion. Neck supple. No JVD present. Carotid bruit is not present. No tracheal deviation present. No thyromegaly present.  Cardiovascular: Normal rate, regular rhythm, normal heart sounds and intact distal pulses.  Exam reveals no gallop and no friction rub.   No murmur heard. Pulmonary/Chest: Effort normal and breath sounds normal. No respiratory distress. She has no wheezes. She has no rales. She exhibits no tenderness.  Abdominal: Soft. Bowel sounds are normal. She exhibits no distension and no mass. There is no tenderness. There is no rebound and no guarding.  Musculoskeletal: Normal range of motion. She exhibits no edema or tenderness.  Lymphadenopathy:    She has no cervical adenopathy.  Neurological: She is alert and oriented to person, place, and time.  Skin: Skin is warm and dry. No rash noted. She is not diaphoretic.  Psychiatric: She has a normal mood and affect. Her behavior is normal. Judgment and thought content normal.  Vitals reviewed.     Assessment & Plan:     1. Encounter for biometric screening Form was completed and she was cleared for physical participation in the [employer] program.        Alessandra BevelsJennifer M  Marlyn Corporal, PA-C  [facility]

## 2016-07-15 NOTE — Patient Instructions (Signed)

## 2016-07-20 ENCOUNTER — Ambulatory Visit (INDEPENDENT_AMBULATORY_CARE_PROVIDER_SITE_OTHER): Payer: BLUE CROSS/BLUE SHIELD | Admitting: Physician Assistant

## 2016-07-20 VITALS — Temp 98.0°F | Wt 150.6 lb

## 2016-07-20 DIAGNOSIS — Z23 Encounter for immunization: Secondary | ICD-10-CM | POA: Diagnosis not present

## 2016-07-20 NOTE — Progress Notes (Signed)
Patient tolerated well. Observed for 10-15 minutes.

## 2016-07-25 ENCOUNTER — Other Ambulatory Visit: Payer: Self-pay | Admitting: Family Medicine

## 2016-07-25 NOTE — Telephone Encounter (Signed)
Jenni's pt.   Thanks,   -Mekisha Bittel  

## 2017-10-04 ENCOUNTER — Encounter: Payer: Self-pay | Admitting: Medical

## 2017-10-04 ENCOUNTER — Ambulatory Visit: Payer: Self-pay | Admitting: Medical

## 2017-10-04 VITALS — BP 106/68 | HR 93 | Temp 98.3°F | Resp 16 | Ht 63.0 in | Wt 144.0 lb

## 2017-10-04 DIAGNOSIS — H1033 Unspecified acute conjunctivitis, bilateral: Secondary | ICD-10-CM

## 2017-10-04 DIAGNOSIS — Z7184 Encounter for health counseling related to travel: Secondary | ICD-10-CM

## 2017-10-04 MED ORDER — CIPROFLOXACIN HCL 500 MG PO TABS: 500.0000 mg | ORAL_TABLET | Freq: Two times a day (BID) | ORAL | 0 refills | Status: DC

## 2017-10-04 MED ORDER — ACETAZOLAMIDE 125 MG PO TABS: 125.0000 mg | ORAL_TABLET | Freq: Every day | ORAL | 0 refills | Status: DC

## 2017-10-04 MED ORDER — GENTAMICIN SULFATE 0.3 % OP SOLN: 1.0000 [drp] | OPHTHALMIC | 0 refills | Status: DC

## 2017-10-04 NOTE — Patient Instructions (Addendum)

## 2017-10-04 NOTE — Progress Notes (Signed)
Patient will be flying in to [place], to [place] and then [place].  Subjective:    Patient ID: Bridget Pollard, female    DOB: May 24, 1956, 61 y.o.   MRN: [id number]  HPI  61 yo female Here for travel advice to [place] in non acute distress.Bridget Pollard.  Pink eye started Sep 14, 2017 R> L, no visual changes , discharge and discomfort feeling.    Review of Systems  Eyes: Positive for pain, discharge and redness. Negative for photophobia, itching and visual disturbance.       Objective:   Physical Exam  Constitutional: She is oriented to person, place, and time. She appears well-developed and well-nourished.  HENT:  Head: Normocephalic and atraumatic.  Eyes: EOM are normal. Pupils are equal, round, and reactive to light. Right eye exhibits discharge. Left eye exhibits no discharge. No scleral icterus.  Neck: Normal range of motion.  Cardiovascular: Normal rate, regular rhythm and normal heart sounds.  Pulmonary/Chest: Effort normal and breath sounds normal.  Neurological: She is alert and oriented to person, place, and time.  Skin: Skin is warm and dry.  Psychiatric: She has a normal mood and affect. Her behavior is normal. Judgment and thought content normal.  Nursing note and vitals reviewed.         Assessment & Plan:   Travel advice , traveling to [place] travel checklist given to patient. [place] medication given to patient. Reviewed with patient Travelers Diarrhea and medication for treatment eprescribed. Bacterial conjunctivitis bilateral To return to clinic in  3-5 days if eyes are not improving. Meds ordered this encounter  Medications  . gentamicin (GARAMYCIN) 0.3 % ophthalmic solution    Sig: Place 1 drop every 4 (four) hours into both eyes. X 7 days    Dispense:  5 mL    Refill:  0  . acetaZOLAMIDE (DIAMOX) 125 MG tablet    Sig: Take 1 tablet (125 mg total) daily by mouth. Start 24-48 hours  prior to ascent and continue for 5 days    Dispense:  7 tablet    Refill:   0  . ciprofloxacin (CIPRO) 500 MG tablet    Sig: Take 1 tablet (500 mg total) 2 (two) times daily by mouth. For travelers diarrhea    Dispense:  6 tablet    Refill:  0   patient verbalizes understanding and has no questions at discharge.

## 2017-11-17 ENCOUNTER — Ambulatory Visit: Payer: Self-pay | Admitting: Adult Health

## 2017-11-17 DIAGNOSIS — Z719 Counseling, unspecified: Secondary | ICD-10-CM

## 2017-11-23 NOTE — Progress Notes (Signed)
I have not seen this patient at all this was a nurse visit only.

## 2018-09-06 NOTE — Addendum Note (Signed)
Addended by: Berniece Pap on: 09/06/2018 02:30 PM   Modules accepted: Kipp Brood

## 2018-09-13 NOTE — Telephone Encounter (Signed)
Attempted to contact patient but was unable to get an answer.  Left her a message to call us back or to answer the questions in my chart and sent it to Marvell Fuller NP.

## 2018-09-18 ENCOUNTER — Other Ambulatory Visit: Payer: Self-pay | Admitting: Medical

## 2018-09-18 DIAGNOSIS — Z7184 Encounter for health counseling related to travel: Secondary | ICD-10-CM

## 2018-09-18 NOTE — Progress Notes (Signed)
Patient traveling checking CBC and Met C

## 2018-09-19 ENCOUNTER — Other Ambulatory Visit: Payer: Self-pay

## 2018-09-19 DIAGNOSIS — Z7184 Encounter for health counseling related to travel: Secondary | ICD-10-CM

## 2018-09-20 ENCOUNTER — Telehealth: Payer: Self-pay | Admitting: Medical

## 2018-09-20 LAB — CBC WITH DIFFERENTIAL/PLATELET
BASOS: 1 %
Basophils Absolute: 0 10*3/uL (ref 0.0–0.2)
EOS (ABSOLUTE): 0.1 10*3/uL (ref 0.0–0.4)
EOS: 3 %
HEMATOCRIT: 38.5 % (ref 34.0–46.6)
HEMOGLOBIN: 13.2 g/dL (ref 11.1–15.9)
Immature Grans (Abs): 0 10*3/uL (ref 0.0–0.1)
Immature Granulocytes: 0 %
Lymphocytes Absolute: 0.8 10*3/uL (ref 0.7–3.1)
Lymphs: 24 %
MCH: 31.6 pg (ref 26.6–33.0)
MCHC: 34.3 g/dL (ref 31.5–35.7)
MCV: 92 fL (ref 79–97)
MONOCYTES: 16 %
Monocytes Absolute: 0.5 10*3/uL (ref 0.1–0.9)
Neutrophils Absolute: 1.8 10*3/uL (ref 1.4–7.0)
Neutrophils: 56 %
Platelets: 251 10*3/uL (ref 150–450)
RBC: 4.18 x10E6/uL (ref 3.77–5.28)
RDW: 11.9 % — ABNORMAL LOW (ref 12.3–15.4)
WBC: 3.2 10*3/uL — AB (ref 3.4–10.8)

## 2018-09-20 LAB — COMPREHENSIVE METABOLIC PANEL
ALT: 15 IU/L (ref 0–32)
AST: 18 IU/L (ref 0–40)
Albumin/Globulin Ratio: 1.7 (ref 1.2–2.2)
Albumin: 4.3 g/dL (ref 3.6–4.8)
Alkaline Phosphatase: 100 IU/L (ref 39–117)
BUN/Creatinine Ratio: 21 (ref 12–28)
BUN: 15 mg/dL (ref 8–27)
Bilirubin Total: 0.3 mg/dL (ref 0.0–1.2)
CALCIUM: 9.3 mg/dL (ref 8.7–10.3)
CO2: 24 mmol/L (ref 20–29)
CREATININE: 0.71 mg/dL (ref 0.57–1.00)
Chloride: 102 mmol/L (ref 96–106)
GFR, EST AFRICAN AMERICAN: 107 mL/min/{1.73_m2} (ref >59)
GFR, EST NON AFRICAN AMERICAN: 93 mL/min/{1.73_m2} (ref >59)
GLOBULIN, TOTAL: 2.5 g/dL (ref 1.5–4.5)
GLUCOSE: 91 mg/dL (ref 65–99)
Potassium: 4.4 mmol/L (ref 3.5–5.2)
SODIUM: 140 mmol/L (ref 134–144)
TOTAL PROTEIN: 6.8 g/dL (ref 6.0–8.5)

## 2018-09-20 NOTE — Telephone Encounter (Signed)
Seen Patient in clinic yesterday, she would like a MMR vaccine. Name on MMR in refrigerator for her 10/28 appointment. Emailed patient to have her provide cities she is visiting and elevations of her trip to [place]. Waiting on response.

## 2018-09-24 ENCOUNTER — Ambulatory Visit: Payer: Self-pay | Admitting: Medical

## 2018-09-24 ENCOUNTER — Encounter: Payer: Self-pay | Admitting: Medical

## 2018-09-24 VITALS — BP 116/72 | HR 76 | Temp 97.7°F | Resp 16 | Wt 145.0 lb

## 2018-09-24 DIAGNOSIS — Z0000 Encounter for general adult medical examination without abnormal findings: Secondary | ICD-10-CM

## 2018-09-24 DIAGNOSIS — Z7184 Encounter for health counseling related to travel: Secondary | ICD-10-CM

## 2018-09-24 MED ORDER — CIPROFLOXACIN HCL 500 MG PO TABS: 500.0000 mg | ORAL_TABLET | Freq: Two times a day (BID) | ORAL | 0 refills | Status: DC

## 2018-09-24 MED ORDER — ACETAZOLAMIDE 125 MG PO TABS: 125.0000 mg | ORAL_TABLET | Freq: Every day | ORAL | 0 refills | Status: DC

## 2018-09-24 NOTE — Progress Notes (Addendum)
Subjective:    Patient ID: Bridget Pollard, female    DOB: 1956/06/27, 62 y.o.   MRN: [id number]  HPI  62 yo female in non acute distress.  Here for review of travel consult to [place]. Patient has already received Hep A and Hep B series. Typhoid oral vaccine in 2016 and Tdap in 2017. She is traveling  12,000-15,000 feet during her trip , which means she does not need Malaria pills or Yellow Fever vaccination. She has one documented MMR.  Blood pressure 116/72, pulse 76, temperature 97.7 F (36.5 C), temperature source Tympanic, resp. rate 16, weight 145 lb (65.8 kg), SpO2 98 %.  Allergies  Allergen Reactions  . Penicillins Hives   Review of Systems  Allergic/Immunologic: Negative for environmental allergies and food allergies.  All other systems reviewed and are negative.     Influenza vaccine  [facility] [employer] [facility] .2019    Objective:   Physical Exam  Constitutional: She is oriented to person, place, and time. She appears well-nourished.  HENT:  Head: Normocephalic and atraumatic.  Eyes: Pupils are equal, round, and reactive to light. Conjunctivae and EOM are normal.  Neck: Normal range of motion.  Cardiovascular: Normal rate, regular rhythm and normal heart sounds.  Pulmonary/Chest: Effort normal and breath sounds normal.  Neurological: She is alert and oriented to person, place, and time.  Skin: Skin is warm and dry.  Psychiatric: She has a normal mood and affect. Her behavior is normal. Judgment and thought content normal.  Nursing note and vitals reviewed.   Recent Results (from the past 2160 hour(s))  Comprehensive metabolic panel     Status: None   Collection Time: 09/19/18  8:18 AM  Result Value Ref Range   Glucose 91 65 - 99 mg/dL   BUN 15 8 - 27 mg/dL   Creatinine, Ser 0.71 0.57 - 1.00 mg/dL   GFR calc non Af Amer 93 >59 mL/min/1.73   GFR calc Af Amer 107 >59 mL/min/1.73   BUN/Creatinine Ratio 21 12 - 28   Sodium 140 134 - 144 mmol/L   Potassium 4.4 3.5 -  5.2 mmol/L   Chloride 102 96 - 106 mmol/L   CO2 24 20 - 29 mmol/L   Calcium 9.3 8.7 - 10.3 mg/dL   Total Protein 6.8 6.0 - 8.5 g/dL   Albumin 4.3 3.6 - 4.8 g/dL   Globulin, Total 2.5 1.5 - 4.5 g/dL   Albumin/Globulin Ratio 1.7 1.2 - 2.2   Bilirubin Total 0.3 0.0 - 1.2 mg/dL   Alkaline Phosphatase 100 39 - 117 IU/L   AST 18 0 - 40 IU/L   ALT 15 0 - 32 IU/L  CBC with Differential/Platelet     Status: Abnormal   Collection Time: 09/19/18  8:18 AM  Result Value Ref Range   WBC 3.2 (L) 3.4 - 10.8 x10E3/uL   RBC 4.18 3.77 - 5.28 x10E6/uL   Hemoglobin 13.2 11.1 - 15.9 g/dL   Hematocrit 38.5 34.0 - 46.6 %   MCV 92 79 - 97 fL   MCH 31.6 26.6 - 33.0 pg   MCHC 34.3 31.5 - 35.7 g/dL   RDW 11.9 (L) 12.3 - 15.4 %   Platelets 251 150 - 450 x10E3/uL   Neutrophils 56 Not Estab. %   Lymphs 24 Not Estab. %   Monocytes 16 Not Estab. %   Eos 3 Not Estab. %   Basos 1 Not Estab. %   Neutrophils Absolute 1.8 1.4 - 7.0 x10E3/uL  Lymphocytes Absolute 0.8 0.7 - 3.1 x10E3/uL   Monocytes Absolute 0.5 0.1 - 0.9 x10E3/uL   EOS (ABSOLUTE) 0.1 0.0 - 0.4 x10E3/uL   Basophils Absolute 0.0 0.0 - 0.2 x10E3/uL   Immature Granulocytes 0 Not Estab. %   Immature Grans (Abs) 0.0 0.0 - 0.1 x10E3/uL        Assessment & Plan:  Travel consult. travel form completed and scanned into media. MMR vaccine/ VIS  given. Checklist for packing to [place] given. Meds ordered this encounter  Medications  . ciprofloxacin (CIPRO) 500 MG tablet    Sig: Take 1 tablet (500 mg total) by mouth 2 (two) times daily. For travelers diarrhea    Dispense:  14 tablet    Refill:  0  . acetaZOLAMIDE (DIAMOX) 125 MG tablet    Sig: Take 1 tablet (125 mg total) by mouth daily. Start 24-48 hours  prior to ascent and continue for 5 days    Dispense:  7 tablet    Refill:  0  CDC site reviewed with patient. Information about travel clinics with Names and locations given to patient.  Other Vaccine documentation will be scanned into media scan  tab in epic.   Patient verbalizes understanding and has no questions at discharge.  Return to the clinic as needed.

## 2019-01-28 ENCOUNTER — Ambulatory Visit: Payer: Self-pay | Admitting: Medical

## 2019-01-28 ENCOUNTER — Ambulatory Visit: Admission: RE | Admit: 2019-01-28 | Payer: BLUE CROSS/BLUE SHIELD | Source: Ambulatory Visit

## 2019-01-28 ENCOUNTER — Encounter: Payer: Self-pay | Admitting: Medical

## 2019-01-28 VITALS — BP 128/74 | HR 99 | Temp 99.0°F | Resp 16 | Wt 146.6 lb

## 2019-01-28 DIAGNOSIS — M79661 Pain in right lower leg: Secondary | ICD-10-CM

## 2019-01-28 DIAGNOSIS — B349 Viral infection, unspecified: Secondary | ICD-10-CM

## 2019-01-28 NOTE — Progress Notes (Signed)
Subjective:    Patient ID: Bridget Pollard, female    DOB: 1955-12-04, 63 y.o.   MRN: [id number]  HPI 63 yo female in non acute distress , notice on Tuesday with scartchy throat. Recently travel to [place]  2/11-14/2020  and then  [place] returned 8 days ago.And that is when she started to feel ill. Nasal discharge is clear. Some body aches. Does feel as if she has had a fever/chills , but did not take temprature. Some Fatigue, but feels this may be to her traveling a lot recently. Cough non productive,. Denies shortness of breath or chest pain.  .Blood pressure 128/74, pulse 99, temperature 99 F (37.2 C), temperature source Tympanic, resp. rate 16, weight 146 lb 9.6 oz (66.5 kg), SpO2 97 %.  Allergies  Allergen Reactions  . Penicillins Hives     Right calf pain, I just keep moving it and it has not gotten better. "I wonder if I have a DVT" No prior history of  DVT or Pulmonary Emboli of paient or in family history. Recent flying history to [place].  I last saw patient for travel consult, she was traveling to [employer] for J-term. Review of Systems  Constitutional: Positive for chills, fatigue and fever.  HENT: Positive for congestion (today only), sinus pressure, sinus pain (temple area, better now), sneezing and sore throat (scratchy throat). Negative for ear pain and trouble swallowing.   Eyes: Negative for discharge and itching.  Respiratory: Positive for cough (non productive). Negative for chest tightness, shortness of breath and wheezing.   Cardiovascular: Negative for chest pain.  Gastrointestinal: Positive for diarrhea (clear per patient). Negative for abdominal pain, nausea and vomiting.  Genitourinary: Positive for vaginal discharge (clear , tinge of yellow). Negative for dysuria and hematuria.  Musculoskeletal: Positive for myalgias.  Skin: Negative for rash.  Allergic/Immunologic: Negative for environmental allergies and food allergies.  Neurological: Negative for  dizziness, syncope, light-headedness and headaches.  Hematological: Negative for adenopathy.  Psychiatric/Behavioral: Positive for sleep disturbance (due to cough). Negative for behavioral problems, confusion, self-injury and suicidal ideas.    right calft "just hurts, has been resting foot on a chair. Has flown recently.    Objective:   Physical Exam Vitals signs and nursing note reviewed.  Constitutional:      Appearance: Normal appearance. She is normal weight.  HENT:     Head: Normocephalic and atraumatic.     Right Ear: Hearing, tympanic membrane, ear canal and external ear normal.     Left Ear: Hearing, tympanic membrane, ear canal and external ear normal.  No middle ear effusion.     Nose: Congestion and rhinorrhea present.     Right Turbinates: Not enlarged or swollen.     Left Turbinates: Not enlarged or swollen.     Mouth/Throat:     Lips: Pink.     Mouth: Mucous membranes are moist.     Pharynx: Oropharynx is clear. Uvula midline. No pharyngeal swelling, oropharyngeal exudate, posterior oropharyngeal erythema or uvula swelling.     Tonsils: Swelling: 2+ on the right. 2+ on the left.     Comments: tinly vessicular on posterior pharynx and on uvula Eyes:     Extraocular Movements: Extraocular movements intact.     Conjunctiva/sclera: Conjunctivae normal.     Pupils: Pupils are equal, round, and reactive to light.  Neck:     Musculoskeletal: Normal range of motion and neck supple.  Cardiovascular:     Rate and Rhythm: Normal rate and  regular rhythm.     Pulses: Normal pulses.     Heart sounds: Normal heart sounds.  Pulmonary:     Effort: Pulmonary effort is normal. No respiratory distress.     Breath sounds: Normal breath sounds. No stridor. No wheezing, rhonchi or rales.  Skin:    General: Skin is warm and dry.     Capillary Refill: Capillary refill takes less than 2 seconds.  Neurological:     General: No focal deficit present.     Mental Status: She is alert and  oriented to person, place, and time.  Psychiatric:        Mood and Affect: Mood normal.        Behavior: Behavior normal.        Thought Content: Thought content normal.        Judgment: Judgment normal.    Right mid calf tenderness with palpation. 2+ PT and DP.FROM, full weight bearing.  No equiement to examine  Female  GU. Noted PND posterior pharynx, patient clearing throat a lot in room.    Assessment & Plan:  Viral illness, Post nasal drip, throat PND irritation Right calf pain Orders Placed This Encounter  Procedures  . US Venous Img Lower Unilateral Right    Standing Status:   Future    Number of Occurrences:   1    Standing Expiration Date:   03/29/2020    Order Specific Question:   Reason for Exam (SYMPTOM  OR DIAGNOSIS REQUIRED)    Answer:   right calf pain x 2 days    Order Specific Question:   Preferred imaging location?    Answer:   [facility]    Order Specific Question:   Call Results- Best Contact Number?    Answer:   [phone]   [phone] patient gave me this number to call results. To follow up with her doctor on Vaginal discharge and Diarrhea. For treatment of viral illness can use OTC Flonase and)TC  Loratadine or Zyrtec per package instructions. Motrin or Tylenol prn per package instructions.  Return in 3-5 days if not improving or if worsening , chest pain, shortness of breath, fever over 100.5. Patient verbalizes understanding and has no questions at discharge.

## 2019-01-28 NOTE — Patient Instructions (Signed)
Deep Vein Thrombosis  Deep vein thrombosis (DVT) is a condition in which a blood clot forms in a deep vein, such as a lower leg, thigh, or arm vein. A clot is blood that has thickened into a gel or solid. This condition is dangerous. It can lead to serious and even life-threatening complications if the clot travels to the lungs and causes a blockage (pulmonary embolism). It can also damage veins in the leg. This can result in leg pain, swelling, discoloration, and sores (post-thrombotic syndrome). What are the causes? This condition may be caused by:  A slowdown of blood flow.  Damage to a vein.  A condition that causes blood to clot more easily, such as an inherited clotting disorder. What increases the risk? The following factors may make you more likely to develop this condition:  Being overweight.  Being older, especially over age 60.  Sitting or lying down for more than four hours.  Being in the hospital.  Lack of physical activity (sedentary lifestyle).  Pregnancy, being in childbirth, or having recently given birth.  Taking medicines that contain estrogen, such as medicines to prevent pregnancy.  Smoking.  A history of any of the following: ? Blood clots or a blood clotting disease. ? Peripheral vascular disease. ? Inflammatory bowel disease. ? Cancer. ? Heart disease. ? Genetic conditions that affect how your blood clots, such as Factor V Leiden mutation. ? Neurological diseases that affect your legs (leg paresis). ? A recent injury, such as a car accident. ? Major or lengthy surgery. ? A central line placed inside a large vein. What are the signs or symptoms? Symptoms of this condition include:  Swelling, pain, or tenderness in an arm or leg.  Warmth, redness, or discoloration in an arm or leg. If the clot is in your leg, symptoms may be more noticeable or worse when you stand or walk. Some people may not develop any symptoms. How is this diagnosed? This  condition is diagnosed with:  A medical history and physical exam.  Tests, such as: ? Blood tests. These are done to check how well your blood clots. ? Ultrasound. This is done to check for clots. ? Venogram. For this test, contrast dye is injected into a vein and X-rays are taken to check for any clots. How is this treated? Treatment for this condition depends on:  The cause of your DVT.  Your risk for bleeding or developing more clots.  Any other medical conditions that you have. Treatment may include:  Taking a blood thinner (anticoagulant). This type of medicine prevents clots from forming. It may be taken by mouth, injected under the skin, or injected through an IV (catheter).  Injecting clot-dissolving medicines into the affected vein (catheter-directed thrombolysis).  Having surgery. Surgery may be done to: ? Remove the clot. ? Place a filter in a large vein to catch blood clots before they reach the lungs. Some treatments may be continued for up to six months. Follow these instructions at home: If you are taking blood thinners:  Take the medicine exactly as told by your health care provider. Some blood thinners need to be taken at the same time every day. Do not skip a dose.  Talk with your health care provider before you take any medicines that contain aspirin or NSAIDs. These medicines increase your risk for dangerous bleeding.  Ask your health care provider about foods and drugs that could change the way the medicine works (may interact). Avoid those things if your   health care provider tells you to do so.  Blood thinners can cause easy bruising and may make it difficult to stop bleeding. Because of this: ? Be very careful when using knives, scissors, or other sharp objects. ? Use an electric razor instead of a blade. ? Avoid activities that could cause injury or bruising, and follow instructions about how to prevent falls.  Wear a medical alert bracelet or carry a  card that lists what medicines you take. General instructions  Take over-the-counter and prescription medicines only as told by your health care provider.  Return to your normal activities as told by your health care provider. Ask your health care provider what activities are safe for you.  Wear compression stockings if recommended by your health care provider.  Keep all follow-up visits as told by your health care provider. This is important. How is this prevented? To lower your risk of developing this condition again:  For 30 or more minutes every day, do an activity that: ? Involves moving your arms and legs. ? Increases your heart rate.  When traveling for longer than four hours: ? Exercise your arms and legs every hour. ? Drink plenty of water. ? Avoid drinking alcohol.  Avoid sitting or lying for a long time without moving your legs.  If you have surgery or you are hospitalized, ask about ways to prevent blood clots. These may include taking frequent walks or using anticoagulants.  Stay at a healthy weight.  If you are a woman who is older than age 7, avoid unnecessary use of medicines that contain estrogen, such as some birth control pills.  Do not use any products that contain nicotine or tobacco, such as cigarettes and e-cigarettes. This is especially important if you take estrogen medicines. If you need help quitting, ask your health care provider. Contact a health care provider if:  You miss a dose of your blood thinner.  Your menstrual period is heavier than usual.  You have unusual bruising. Get help right away if:  You have: ? New or increased pain, swelling, or redness in an arm or leg. ? Numbness or tingling in an arm or leg. ? Shortness of breath. ? Chest pain. ? A rapid or irregular heartbeat. ? A severe headache or confusion. ? A cut that will not stop bleeding.  There is blood in your vomit, stool, or urine.  You have a serious fall or accident,  or you hit your head.  You feel light-headed or dizzy.  You cough up blood. These symptoms may represent a serious problem that is an emergency. Do not wait to see if the symptoms will go away. Get medical help right away. Call your local emergency services (911 in the [place] Do not drive yourself to the hospital. Summary  Deep vein thrombosis (DVT) is a condition in which a blood clot forms in a deep vein, such as a lower leg, thigh, or arm vein.  Symptoms can include swelling, warmth, pain, and redness in your leg or arm.  This condition may be treated with a blood thinner (anticoagulant medicine), medicine that is injected to dissolve blood clots,compression stockings, or surgery.  If you are prescribed blood thinners, take them exactly as told. This information is not intended to replace advice given to you by your health care provider. Make sure you discuss any questions you have with your health care provider. Document Released: 11/14/2005 Document Revised: 04/14/2017 Document Reviewed: 04/14/2017 Elsevier Interactive Patient Education  2019 [employer].  Fluticasone nasal spray What is this medicine? FLUTICASONE (floo TIK a sone) is a corticosteroid. This medicine is used to treat the symptoms of allergies like sneezing, itchy red eyes, and itchy, runny, or stuffy nose. This medicine is also used to treat nasal polyps. This medicine may be used for other purposes; ask your health care provider or pharmacist if you have questions. COMMON BRAND NAME(S): ClariSpray, Flonase, Flonase Allergy Relief, Flonase Sensimist, Veramyst, XHANCE What should I tell my health care provider before I take this medicine? They need to know if you have any of these conditions: -eye disease, vision problems -infection, like tuberculosis, herpes, or fungal infection -recent surgery on nose or sinuses -taking a corticosteroid by mouth -an unusual or allergic reaction to fluticasone, steroids, other  medicines, foods, dyes, or preservatives -pregnant or trying to get pregnant -breast-feeding How should I use this medicine? This medicine is for use in the nose. Follow the directions on your product or prescription label. This medicine works best if used at regular intervals. Do not use more often than directed. Make sure that you are using your nasal spray correctly. After 6 months of daily use for allergies, talk to your doctor or health care professional before using it for a longer time. Ask your doctor or health care professional if you have any questions. Talk to your pediatrician regarding the use of this medicine in children. Special care may be needed. Some products have been used for allergies in children as young as 2 years. After 2 months of daily use without a prescription in a child, talk to your pediatrician before using it for a longer time. Use of this medicine for nasal polyps is not approved in children. Overdosage: If you think you have taken too much of this medicine contact a poison control center or emergency room at once. NOTE: This medicine is only for you. Do not share this medicine with others. What if I miss a dose? If you miss a dose, use it as soon as you remember. If it is almost time for your next dose, use only that dose and continue with your regular schedule. Do not use double or extra doses. What may interact with this medicine? -certain antibiotics like clarithromycin and telithromycin -certain medicines for fungal infections like ketoconazole, itraconazole, and voriconazole -conivaptan -nefazodone -some medicines for HIV -vaccines This list may not describe all possible interactions. Give your health care provider a list of all the medicines, herbs, non-prescription drugs, or dietary supplements you use. Also tell them if you smoke, drink alcohol, or use illegal drugs. Some items may interact with your medicine. What should I watch for while using this  medicine? Visit your healthcare professional for regular checks on your progress. Tell your healthcare professional if your symptoms do not start to get better or if they get worse. This medicine may increase your risk of getting an infection. Tell your doctor or health care professional if you are around anyone with measles or chickenpox, or if you develop sores or blisters that do not heal properly. What side effects may I notice from receiving this medicine? Side effects that you should report to your doctor or health care professional as soon as possible: -allergic reactions like skin rash, itching or hives, swelling of the face, lips, or tongue -changes in vision -crusting or sores in the nose -nosebleed -signs and symptoms of infection like fever or chills; cough; sore throat -white patches or sores in the mouth or nose Side effects  that usually do not require medical attention (report to your doctor or health care professional if they continue or are bothersome): -burning or irritation inside the nose or throat -changes in taste or smell -cough -headache This list may not describe all possible side effects. Call your doctor for medical advice about side effects. You may report side effects to FDA at 1-800-FDA-1088. Where should I keep my medicine? Keep out of the reach of children. Store at room temperature between 15 and 30 degrees C (59 and 86 degrees F). Avoid exposure to extreme heat, cold, or light. Throw away any unused medicine after the expiration date. NOTE: This sheet is a summary. It may not cover all possible information. If you have questions about this medicine, talk to your doctor, pharmacist, or health care provider.  2019 Elsevier/Gold Standard (2017-12-07 14:10:08) Cetirizine tablets What is this medicine? CETIRIZINE (se TI ra zeen) is an antihistamine. This medicine is used to treat or prevent symptoms of allergies. It is also used to help reduce itchy skin rash and  hives. This medicine may be used for other purposes; ask your health care provider or pharmacist if you have questions. COMMON BRAND NAME(S): All Day Allergy, Allergy Relief, Zyrtec, Zyrtec Hives Relief What should I tell my health care provider before I take this medicine? They need to know if you have any of these conditions: -kidney disease -liver disease -an unusual or allergic reaction to cetirizine, hydroxyzine, other medicines, foods, dyes, or preservatives -pregnant or trying to get pregnant -breast-feeding How should I use this medicine? Take this medicine by mouth with a glass of water. Follow the directions on the prescription label. You can take this medicine with food or on an empty stomach. Take your medicine at regular times. Do not take more often than directed. You may need to take this medicine for several days before your symptoms improve. Talk to your pediatrician regarding the use of this medicine in children. Special care may be needed. While this drug may be prescribed for children as young as 16 years of age for selected conditions, precautions do apply. Overdosage: If you think you have taken too much of this medicine contact a poison control center or emergency room at once. NOTE: This medicine is only for you. Do not share this medicine with others. What if I miss a dose? If you miss a dose, take it as soon as you can. If it is almost time for your next dose, take only that dose. Do not take double or extra doses. What may interact with this medicine? -alcohol -certain medicines for anxiety or sleep -narcotic medicines for pain -other medicines for colds or allergies This list may not describe all possible interactions. Give your health care provider a list of all the medicines, herbs, non-prescription drugs, or dietary supplements you use. Also tell them if you smoke, drink alcohol, or use illegal drugs. Some items may interact with your medicine. What should I watch  for while using this medicine? Visit your doctor or health care professional for regular checks on your health. Tell your doctor if your symptoms do not improve. You may get drowsy or dizzy. Do not drive, use machinery, or do anything that needs mental alertness until you know how this medicine affects you. Do not stand or sit up quickly, especially if you are an older patient. This reduces the risk of dizzy or fainting spells. Your mouth may get dry. Chewing sugarless gum or sucking hard candy, and drinking plenty  of water may help. Contact your doctor if the problem does not go away or is severe. What side effects may I notice from receiving this medicine? Side effects that you should report to your doctor or health care professional as soon as possible: -allergic reactions like skin rash, itching or hives, swelling of the face, lips, or tongue -changes in vision or hearing -fast or irregular heartbeat -trouble passing urine or change in the amount of urine Side effects that usually do not require medical attention (report to your doctor or health care professional if they continue or are bothersome): -dizziness -dry mouth -irritability -sore throat -stomach pain -tiredness This list may not describe all possible side effects. Call your doctor for medical advice about side effects. You may report side effects to FDA at 1-800-FDA-1088. Where should I keep my medicine? Keep out of the reach of children. Store at room temperature between 15 and 30 degrees C (59 and 86 degrees F). Throw away any unused medicine after the expiration date. NOTE: This sheet is a summary. It may not cover all possible information. If you have questions about this medicine, talk to your doctor, pharmacist, or health care provider.  2019 Elsevier/Gold Standard (2014-12-09 13:44:42) Viral Illness, Adult Viruses are tiny germs that can get into a person's body and cause illness. There are many different types of  viruses, and they cause many types of illness. Viral illnesses can range from mild to severe. They can affect various parts of the body. Common illnesses that are caused by a virus include colds and the flu. Viral illnesses also include serious conditions such as HIV/AIDS (human immunodeficiency virus/acquired immunodeficiency syndrome). A few viruses have been linked to certain cancers. What are the causes? Many types of viruses can cause illness. Viruses invade cells in your body, multiply, and cause the infected cells to malfunction or die. When the cell dies, it releases more of the virus. When this happens, you develop symptoms of the illness, and the virus continues to spread to other cells. If the virus takes over the function of the cell, it can cause the cell to divide and grow out of control, as is the case when a virus causes cancer. Different viruses get into the body in different ways. You can get a virus by:  Swallowing food or water that is contaminated with the virus.  Breathing in droplets that have been coughed or sneezed into the air by an infected person.  Touching a surface that has been contaminated with the virus and then touching your eyes, nose, or mouth.  Being bitten by an insect or animal that carries the virus.  Having sexual contact with a person who is infected with the virus.  Being exposed to blood or fluids that contain the virus, either through an open cut or during a transfusion. If a virus enters your body, your body's defense system (immune system) will try to fight the virus. You may be at higher risk for a viral illness if your immune system is weak. What are the signs or symptoms? Symptoms vary depending on the type of virus and the location of the cells that it invades. Common symptoms of the main types of viral illnesses include: Cold and flu viruses  Fever.  Headache.  Sore throat.  Muscle aches.  Nasal congestion.  Cough. Digestive system  (gastrointestinal) viruses  Fever.  Abdominal pain.  Nausea.  Diarrhea. Liver viruses (hepatitis)  Loss of appetite.  Tiredness.  Yellowing of the skin (  jaundice). Brain and spinal cord viruses  Fever.  Headache.  Stiff neck.  Nausea and vomiting.  Confusion or sleepiness. Skin viruses  Warts.  Itching.  Rash. Sexually transmitted viruses  Discharge.  Swelling.  Redness.  Rash. How is this treated? Viruses can be difficult to treat because they live within cells. Antibiotic medicines do not treat viruses because these drugs do not get inside cells. Treatment for a viral illness may include:  Resting and drinking plenty of fluids.  Medicines to relieve symptoms. These can include over-the-counter medicine for pain and fever, medicines for cough or congestion, and medicines to relieve diarrhea.  Antiviral medicines. These drugs are available only for certain types of viruses. They may help reduce flu symptoms if taken early. There are also many antiviral medicines for hepatitis and HIV/AIDS. Some viral illnesses can be prevented with vaccinations. A common example is the flu shot. Follow these instructions at home: Medicines   Take over-the-counter and prescription medicines only as told by your health care provider.  If you were prescribed an antiviral medicine, take it as told by your health care provider. Do not stop taking the medicine even if you start to feel better.  Be aware of when antibiotics are needed and when they are not needed. Antibiotics do not treat viruses. If your health care provider thinks that you may have a bacterial infection as well as a viral infection, you may get an antibiotic. ? Do not ask for an antibiotic prescription if you have been diagnosed with a viral illness. That will not make your illness go away faster. ? Frequently taking antibiotics when they are not needed can lead to antibiotic resistance. When this develops,  the medicine no longer works against the bacteria that it normally fights. General instructions  Drink enough fluids to keep your urine clear or pale yellow.  Rest as much as possible.  Return to your normal activities as told by your health care provider. Ask your health care provider what activities are safe for you.  Keep all follow-up visits as told by your health care provider. This is important. How is this prevented? Take these actions to reduce your risk of viral infection:  Eat a healthy diet and get enough rest.  Wash your hands often with soap and water. This is especially important when you are in public places. If soap and water are not available, use hand sanitizer.  Avoid close contact with friends and family who have a viral illness.  If you travel to areas where viral gastrointestinal infection is common, avoid drinking water or eating raw food.  Keep your immunizations up to date. Get a flu shot every year as told by your health care provider.  Do not share toothbrushes, nail clippers, razors, or needles with other people.  Always practice safe sex.  Contact a health care provider if:  You have symptoms of a viral illness that do not go away.  Your symptoms come back after going away.  Your symptoms get worse. Get help right away if:  You have trouble breathing.  You have a severe headache or a stiff neck.  You have severe vomiting or abdominal pain. This information is not intended to replace advice given to you by your health care provider. Make sure you discuss any questions you have with your health care provider. Document Released: 03/25/2016 Document Revised: 04/27/2016 Document Reviewed: 03/25/2016 Elsevier Interactive Patient Education  2019 [employer].

## 2019-08-07 ENCOUNTER — Ambulatory Visit: Payer: Self-pay

## 2019-08-07 ENCOUNTER — Other Ambulatory Visit: Payer: Self-pay

## 2019-08-07 DIAGNOSIS — Z23 Encounter for immunization: Secondary | ICD-10-CM

## 2020-11-29 DIAGNOSIS — Z20828 Contact with and (suspected) exposure to other viral communicable diseases: Secondary | ICD-10-CM | POA: Diagnosis not present

## 2020-11-29 DIAGNOSIS — Z03818 Encounter for observation for suspected exposure to other biological agents ruled out: Secondary | ICD-10-CM | POA: Diagnosis not present

## 2020-11-29 DIAGNOSIS — Z20822 Contact with and (suspected) exposure to covid-19: Secondary | ICD-10-CM | POA: Diagnosis not present

## 2020-11-29 DIAGNOSIS — Z1159 Encounter for screening for other viral diseases: Secondary | ICD-10-CM | POA: Diagnosis not present

## 2020-12-22 ENCOUNTER — Encounter: Payer: Self-pay | Admitting: Medical

## 2020-12-22 ENCOUNTER — Other Ambulatory Visit: Payer: Self-pay

## 2020-12-22 ENCOUNTER — Ambulatory Visit: Payer: Self-pay | Admitting: Medical

## 2020-12-22 VITALS — BP 124/62 | HR 82 | Temp 97.6°F | Resp 16 | Wt 151.0 lb

## 2020-12-22 DIAGNOSIS — Z4802 Encounter for removal of sutures: Secondary | ICD-10-CM

## 2020-12-22 NOTE — Patient Instructions (Signed)
Suture Removal, Care After This sheet gives you information about how to care for yourself after your procedure. Your health care provider may also give you more specific instructions. If you have problems or questions, contact your health care provider. What can I expect after the procedure? After your stitches (sutures) are removed, it is common to have:  Some discomfort and swelling in the area.  Slight redness in the area. Follow these instructions at home: If you have a bandage:  Wash your hands with soap and water before you change your bandage (dressing). If soap and water are not available, use hand sanitizer.  Change your dressing as told by your health care provider. If your dressing becomes wet or dirty, or develops a bad smell, change it as soon as possible.  If your dressing sticks to your skin, soak it in warm water to loosen it. Wound care  Check your wound every day for signs of infection. Check for: ? More redness, swelling, or pain. ? Fluid or blood. ? Warmth. ? Pus or a bad smell.  Wash your hands with soap and water before and after touching your wound.  Apply cream or ointment only as directed by your health care provider. If you are using cream or ointment, wash the area with soap and water 2 times a day to remove all the cream or ointment. Rinse off the soap and pat the area dry with a clean towel.  If you have skin glue or adhesive strips on your wound, leave these closures in place. They may need to stay in place for 2 weeks or longer. If adhesive strip edges start to loosen and curl up, you may trim the loose edges. Do not remove adhesive strips completely unless your health care provider tells you to do that.  Keep the wound area dry and clean. Do not take baths, swim, or use a hot tub until your health care provider approves.  Continue to protect the wound from injury.  Do not pick at your wound. Picking can cause an infection.  When your wound has  completely healed, wear sunscreen over it or cover it with clothing when you are outside. New scars get sunburned easily, which can make scarring worse.   General instructions  Take over-the-counter and prescription medicines only as told by your health care provider.  Keep all follow-up visits as told by your health care provider. This is important. Contact a health care provider if:  You have redness, swelling, or pain around your wound.  You have fluid or blood coming from your wound.  Your wound feels warm to the touch.  You have pus or a bad smell coming from your wound.  Your wound opens up. Get help right away if:  You have a fever.  You have redness that is spreading from your wound. Summary  After your sutures are removed, it is common to have some discomfort and swelling in the area.  Wash your hands with soap and water before you change your bandage (dressing).  Keep the wound area dry and clean. Do not take baths, swim, or use a hot tub until your health care provider approves. This information is not intended to replace advice given to you by your health care provider. Make sure you discuss any questions you have with your health care provider. Document Revised: 09/11/2020 Document Reviewed: 09/11/2020 Elsevier Patient Education  2021 [employer]vier Inc.  

## 2020-12-22 NOTE — Progress Notes (Signed)
  Right hand at wrist volar surface, cut hand on conch [place] in [place]. Received 5 stitches.  Hand with good range of motion, no numbness or tingling or weakness. Wound well appproximated no discharge no erythema.  5 Sutures removed after cleaning with H2O2. Neosporin placed on site and area wrapped in gauze and coban. Patient tolerated procedure well. Return to clinic as needed.  Wound care reviewed with patient. She verbalizes understanding and has no questions at discharge.  Allergies  Allergen Reactions  . Penicillins Hives

## 2021-02-24 DIAGNOSIS — M25551 Pain in right hip: Secondary | ICD-10-CM | POA: Diagnosis not present

## 2021-09-06 ENCOUNTER — Encounter: Payer: Self-pay | Admitting: Medical

## 2021-09-06 ENCOUNTER — Ambulatory Visit: Payer: Self-pay | Admitting: Medical

## 2021-09-06 ENCOUNTER — Other Ambulatory Visit: Payer: Self-pay

## 2021-09-06 VITALS — BP 111/68 | HR 95 | Temp 97.8°F | Resp 16 | Ht 63.0 in | Wt 148.0 lb

## 2021-09-06 DIAGNOSIS — R051 Acute cough: Secondary | ICD-10-CM

## 2021-09-06 DIAGNOSIS — H1031 Unspecified acute conjunctivitis, right eye: Secondary | ICD-10-CM

## 2021-09-06 DIAGNOSIS — H109 Unspecified conjunctivitis: Secondary | ICD-10-CM

## 2021-09-06 LAB — POC COVID19 BINAXNOW: SARS Coronavirus 2 Ag: NEGATIVE

## 2021-09-06 MED ORDER — BENZONATATE 100 MG PO CAPS: 100.0000 mg | ORAL_CAPSULE | Freq: Three times a day (TID) | ORAL | 0 refills | Status: DC | PRN

## 2021-09-06 MED ORDER — CIPROFLOXACIN HCL 0.3 % OP SOLN: 1.0000 [drp] | OPHTHALMIC | 0 refills | Status: DC

## 2021-09-06 NOTE — Progress Notes (Addendum)
Right eye conjunctivitis, chart reviewed  Subjective:    Patient ID: Bridget Pollard, female    DOB: September 18, 1956, 65 y.o.   MRN: [id number]  HPI 65 yo female in non acute distress presents to day with history of cough, unknown temp, fever blister, right eye discharge., and ST.  Blood pressure 111/68, pulse 95, temperature 97.8 F (36.6 C), temperature source Oral, resp. rate 16, height 5\' 3"  (1.6 m), weight 148 lb (67.1 kg), SpO2 96 %.   Allergies  Allergen Reactions   Penicillins Hives     Review of Systems  Constitutional:  Positive for fatigue. Negative for chills and fever.  HENT:  Positive for rhinorrhea (bloody). Negative for congestion, sinus pressure, sinus pain, sneezing and sore throat.   Eyes:  Positive for redness. Negative for photophobia, pain, discharge, itching and visual disturbance.  Respiratory:  Positive for cough. Negative for shortness of breath.   Cardiovascular:  Negative for chest pain.  Gastrointestinal:  Positive for nausea. Negative for abdominal pain and diarrhea.  Musculoskeletal:  Negative for myalgias.  Skin:  Negative for rash.       Left upper lip cold sore.  Allergic/Immunologic: Negative for environmental allergies.  Neurological:  Positive for headaches (temples  x 2 days). Negative for dizziness, syncope and light-headedness.      Objective:   Physical Exam Vitals and nursing note reviewed.  Constitutional:      Appearance: Normal appearance.  HENT:     Head: Normocephalic and atraumatic.     Right Ear: Tympanic membrane, ear canal and external ear normal.     Left Ear: Tympanic membrane, ear canal and external ear normal.     Mouth/Throat:     Mouth: Mucous membranes are moist.  Eyes:     General: No scleral icterus.       Right eye: No discharge.     Extraocular Movements: Extraocular movements intact.     Pupils: Pupils are equal, round, and reactive to light.     Comments: Red sclera  Cardiovascular:     Rate and Rhythm: Normal  rate and regular rhythm.  Pulmonary:     Effort: Pulmonary effort is normal.     Breath sounds: Normal breath sounds.  Musculoskeletal:        General: Normal range of motion.     Cervical back: Normal range of motion and neck supple.  Lymphadenopathy:     Cervical: No cervical adenopathy.  Skin:    General: Skin is warm and dry.  Neurological:     General: No focal deficit present.     Mental Status: She is alert and oriented to person, place, and time.  Psychiatric:        Mood and Affect: Mood normal.        Behavior: Behavior normal.        Thought Content: Thought content normal.        Judgment: Judgment normal.   Right eye sclera , no discharge noted.  Recent Results (from the past 2160 hour(s))  Novel Coronavirus, NAA ([facility])     Status: None   Collection Time: 09/06/21  9:29 AM   Specimen: Nasopharyngeal(NP) swabs in vial transport medium   Nasopharynge  Previous  Result Value Ref Range   SARS-CoV-2, NAA Not Detected Not Detected    Comment: This nucleic acid amplification test was developed and its performance characteristics determined by 11/06/21. Nucleic acid amplification tests include RT-PCR and TMA. This test has not been FDA cleared  or approved. This test has been authorized by FDA under an Emergency Use Authorization (EUA). This test is only authorized for the duration of time the declaration that circumstances exist justifying the authorization of the emergency use of in vitro diagnostic tests for detection of SARS-CoV-2 virus and/or diagnosis of COVID-19 infection under section 564(b)(1) of the Act, 21 U.S.C. 497WYO-3(Z) (1), unless the authorization is terminated or revoked sooner. When diagnostic testing is negative, the possibility of a false negative result should be considered in the context of a patient's recent exposures and the presence of clinical signs and symptoms consistent with COVID-19. An individual without symptoms of  COVID-19 and who is not shedding SARS-CoV-2 virus wo uld expect to have a negative (not detected) result in this assay.   POC COVID-19     Status: Normal   Collection Time: 09/06/21  9:29 AM  Result Value Ref Range   SARS Coronavirus 2 Ag Negative Negative  SARS-COV-2, NAA 2 DAY TAT     Status: None   Collection Time: 09/06/21  9:29 AM   Nasopharynge  Previous  Result Value Ref Range   SARS-CoV-2, NAA 2 DAY TAT Performed      Assessment & Plan:  Cough Covid-19 PCR Pending Conjunctivitis Meds ordered this encounter  Medications   ciprofloxacin (CILOXAN) 0.3 % ophthalmic solution    Sig: Place 1 drop into the right eye every 2 (two) hours. Administer 1 drop, every 2 hours, while awake, for 2 days. Then 1 drop, every 4 hours, while awake, for the next 5 days.    Dispense:  5 mL    Refill:  0   benzonatate (TESSALON PERLES) 100 MG capsule    Sig: Take 1 capsule (100 mg total) by mouth 3 (three) times daily as needed for cough.    Dispense:  30 capsule    Refill:  0    Return in 3-5 days to the clinic or your PCP if not improving. Patient verbalizes understanding and has no questions at discharge.  Recent Results (from the past 2160 hour(s))  Novel Coronavirus, NAA ([facility])     Status: None   Collection Time: 09/06/21  9:29 AM   Specimen: Nasopharyngeal(NP) swabs in vial transport medium   Nasopharynge  Previous  Result Value Ref Range   SARS-CoV-2, NAA Not Detected Not Detected    Comment: This nucleic acid amplification test was developed and its performance characteristics determined by [employer]. Nucleic acid amplification tests include RT-PCR and TMA. This test has not been FDA cleared or approved. This test has been authorized by FDA under an Emergency Use Authorization (EUA). This test is only authorized for the duration of time the declaration that circumstances exist justifying the authorization of the emergency use of in vitro diagnostic tests for  detection of SARS-CoV-2 virus and/or diagnosis of COVID-19 infection under section 564(b)(1) of the Act, 21 U.S.C. 858IFO-2(D) (1), unless the authorization is terminated or revoked sooner. When diagnostic testing is negative, the possibility of a false negative result should be considered in the context of a patient's recent exposures and the presence of clinical signs and symptoms consistent with COVID-19. An individual without symptoms of COVID-19 and who is not shedding SARS-CoV-2 virus wo uld expect to have a negative (not detected) result in this assay.   POC COVID-19     Status: Normal   Collection Time: 09/06/21  9:29 AM  Result Value Ref Range   SARS Coronavirus 2 Ag Negative Negative  SARS-COV-2, NAA 2  DAY TAT     Status: None   Collection Time: 09/06/21  9:29 AM   Nasopharynge  Previous  Result Value Ref Range   SARS-CoV-2, NAA 2 DAY TAT Performed

## 2021-09-07 LAB — NOVEL CORONAVIRUS, NAA: SARS-CoV-2, NAA: NOT DETECTED

## 2021-09-07 LAB — SARS-COV-2, NAA 2 DAY TAT

## 2021-09-10 ENCOUNTER — Other Ambulatory Visit: Payer: Self-pay | Admitting: Physician Assistant

## 2021-09-10 DIAGNOSIS — Z139 Encounter for screening, unspecified: Secondary | ICD-10-CM

## 2021-09-14 ENCOUNTER — Ambulatory Visit
Admission: RE | Admit: 2021-09-14 | Discharge: 2021-09-14 | Disposition: A | Discharge status: Home / Self Care | Payer: BC Managed Care – PPO | Source: Ambulatory Visit | Attending: Physician Assistant | Admitting: Physician Assistant

## 2021-09-14 ENCOUNTER — Other Ambulatory Visit: Payer: Self-pay

## 2021-09-14 DIAGNOSIS — Z139 Encounter for screening, unspecified: Secondary | ICD-10-CM

## 2021-09-14 DIAGNOSIS — Z1231 Encounter for screening mammogram for malignant neoplasm of breast: Secondary | ICD-10-CM | POA: Diagnosis not present

## 2021-09-20 DIAGNOSIS — Z23 Encounter for immunization: Secondary | ICD-10-CM | POA: Diagnosis not present

## 2021-09-24 MED ORDER — CIPROFLOXACIN HCL 0.3 % OP SOLN: 1.0000 [drp] | OPHTHALMIC | 0 refills | Status: DC

## 2021-10-04 ENCOUNTER — Other Ambulatory Visit: Payer: Self-pay | Admitting: Urology

## 2021-10-10 LAB — FECAL OCCULT BLOOD, IMMUNOCHEMICAL: Fecal Occult Bld: NEGATIVE

## 2021-10-11 ENCOUNTER — Telehealth: Payer: Self-pay

## 2021-10-11 NOTE — Telephone Encounter (Addendum)
Patient returned call and was informed negative FIT test results. Patient verbalized understanding.   Attempted to contact patient regarding FIT Test results. Left message on voicemail requesting a return call.

## 2022-03-28 DIAGNOSIS — R7309 Other abnormal glucose: Secondary | ICD-10-CM | POA: Diagnosis not present

## 2022-04-19 ENCOUNTER — Ambulatory Visit: Payer: Self-pay | Admitting: *Deleted

## 2022-04-19 NOTE — Telephone Encounter (Signed)
  Chief Complaint: Right hip pain, painful to walk.   Symptoms: Having pain in right hip, down right buttock and into right thigh Frequency: daily Pertinent Negatives: Patient denies having arthritis or bone issues.   Had work up and x rays at [facility] of right hip. Disposition: [] ED /[] Urgent Care (no appt availability in office) / [] Appointment(In office/virtual)/ []  [place] [facility]/ [x] Home Care/ [] Refused Recommended Disposition /[]  Mobile Bus/ []  Follow-up with PCP Additional Notes: Home care advice given until her new pt appt. In July 2023.

## 2022-04-19 NOTE — Telephone Encounter (Signed)
I returned pt's call.   Not an established pt with [facility].  Has new pt appt with Merita Norton, PA on 726/2023. C/o right hip pain since Covid.  Legs are giving out with it happening as recently as yesterday.  Walking is painful, and has severe pain at times when she's not doing anything.   Reason for Disposition  Hip pain    Pt has new pt appt on 06/22/2022 so went over home remedies to try for hip pain until seen.  Been seen and x rayed at [facility] for right hip pain.  Answer Assessment - Initial Assessment Questions 1. LOCATION and RADIATION: "Where is the pain located?"      I having pain in my hip.  I think I may have sciatica from my symptoms.   I went to [facility] and they took x rays of hip said the hip looked fine.   They gave me exercise for muscular issues. 2. QUALITY: "What does the pain feel like?"  (e.g., sharp, dull, aching, burning)     Pain in upper leg and right hip.   I do the exercises, sometimes it helps. Today pain 1/10.    Sometimes 7/10.   I'm taking Naproxen and it helps the pain.  I think it's my sciatica.   3. SEVERITY: "How bad is the pain?" "What does it keep you from doing?"   (Scale 1-10; or mild, moderate, severe)   -  MILD (1-3): doesn't interfere with normal activities    -  MODERATE (4-7): interferes with normal activities (e.g., work or school) or awakens from sleep, limping    -  SEVERE (8-10): excruciating pain, unable to do any normal activities, unable to walk     Today it's a 1/10 but at times it's 7/10. 4. ONSET: "When did the pain start?" "Does it come and go, or is it there all the time?"     Not asked since evaluated at [facility] 5. WORK OR EXERCISE: "Has there been any recent work or exercise that involved this part of the body?"      N/A 6. CAUSE: "What do you think is causing the hip pain?"      I think it's sciatic pain from what I read on the internet. 7. AGGRAVATING FACTORS: "What makes the hip pain worse?" (e.g.,  walking, climbing stairs, running)     Walking 8. OTHER SYMPTOMS: "Do you have any other symptoms?" (e.g., back pain, pain shooting down leg,  fever, rash)     Pain in my thigh and buttock on right side.   "I fit the description of sciatic pain.  Protocols used: Hip Pain-A-AH

## 2022-04-28 DIAGNOSIS — R7309 Other abnormal glucose: Secondary | ICD-10-CM | POA: Diagnosis not present

## 2022-05-28 DIAGNOSIS — R7309 Other abnormal glucose: Secondary | ICD-10-CM | POA: Diagnosis not present

## 2022-06-22 ENCOUNTER — Encounter: Payer: Self-pay | Admitting: Family Medicine

## 2022-06-22 ENCOUNTER — Ambulatory Visit (INDEPENDENT_AMBULATORY_CARE_PROVIDER_SITE_OTHER): Payer: BC Managed Care – PPO | Admitting: Family Medicine

## 2022-06-22 VITALS — BP 118/77 | HR 70 | Temp 98.5°F | Ht 63.0 in | Wt 155.0 lb

## 2022-06-22 DIAGNOSIS — Z825 Family history of asthma and other chronic lower respiratory diseases: Secondary | ICD-10-CM

## 2022-06-22 DIAGNOSIS — Z8249 Family history of ischemic heart disease and other diseases of the circulatory system: Secondary | ICD-10-CM

## 2022-06-22 DIAGNOSIS — Z114 Encounter for screening for human immunodeficiency virus [HIV]: Secondary | ICD-10-CM | POA: Diagnosis not present

## 2022-06-22 DIAGNOSIS — Z1211 Encounter for screening for malignant neoplasm of colon: Secondary | ICD-10-CM

## 2022-06-22 DIAGNOSIS — M25551 Pain in right hip: Secondary | ICD-10-CM

## 2022-06-22 DIAGNOSIS — E782 Mixed hyperlipidemia: Secondary | ICD-10-CM | POA: Diagnosis not present

## 2022-06-22 DIAGNOSIS — Z87891 Personal history of nicotine dependence: Secondary | ICD-10-CM

## 2022-06-22 DIAGNOSIS — E221 Hyperprolactinemia: Secondary | ICD-10-CM | POA: Diagnosis not present

## 2022-06-22 DIAGNOSIS — Z1159 Encounter for screening for other viral diseases: Secondary | ICD-10-CM | POA: Diagnosis not present

## 2022-06-22 DIAGNOSIS — Z1331 Encounter for screening for depression: Secondary | ICD-10-CM

## 2022-06-22 DIAGNOSIS — Z833 Family history of diabetes mellitus: Secondary | ICD-10-CM

## 2022-06-22 MED ORDER — MELOXICAM 15 MG PO TABS: 15.0000 mg | ORAL_TABLET | Freq: Every day | ORAL | 0 refills | Status: DC

## 2022-06-22 MED ORDER — PREDNISONE 10 MG PO TABS
ORAL_TABLET | ORAL | 0 refills | Status: DC
Start: 2022-06-22 — End: 2022-12-02

## 2022-06-22 MED ORDER — METHOCARBAMOL 750 MG PO TABS: 750.0000 mg | ORAL_TABLET | Freq: Two times a day (BID) | ORAL | 0 refills | Status: DC

## 2022-06-22 NOTE — Assessment & Plan Note (Signed)
Seen in father; ultimately cause of passing

## 2022-06-22 NOTE — Assessment & Plan Note (Signed)
Chronic, variable, can fluctuate within the day Exacerbated by posturing including leaning and bending or getting in and out of car Reports being seen at [facility]; unable to pull records on Care Everywhere No bursa involvement Occasional ataxic joint d/t pain Slight improvement with 2 week trial of naproxen  Recommend use of mobic, robaxin, and prednisone to assist F/u with ortho/sports medicine if needed

## 2022-06-22 NOTE — Assessment & Plan Note (Signed)
In brother Check A1c Continue to recommend balanced, lower carb meals. Smaller meal size, adding snacks. Choosing water as drink of choice and increasing purposeful exercise.

## 2022-06-22 NOTE — Assessment & Plan Note (Signed)
Seen in multiple family members Patient with normal BP despite pain in R hip of 4/10 today in office

## 2022-06-22 NOTE — Assessment & Plan Note (Signed)
Chronic, stable Repeat prolactin Post menopausal without break through bleeding

## 2022-06-22 NOTE — Assessment & Plan Note (Signed)
Acute, stable No concern for depression or anxiety on exam Patient did not express any concerns Continue to monitor

## 2022-06-22 NOTE — Assessment & Plan Note (Signed)
Chronic, previously elevated Body mass index is 27.46 kg/m. Normal BP 118/77 Family hx of MI and CVA Repeat LP recommend diet low in saturated fat and regular exercise - 30 min at least 5 times per week

## 2022-06-22 NOTE — Assessment & Plan Note (Signed)
Declines low dose CT for lung cancer screening; reports cigarette use >40 years ago for 1 ppd for <5 years

## 2022-06-22 NOTE — Assessment & Plan Note (Signed)
Low risk screen ?Consented; encouraged to "know your status" ?Recommend repeat screen if risk factors change ? ?

## 2022-06-22 NOTE — Assessment & Plan Note (Signed)
Low risk screen Treatable, and curable. If left untreated Hep C can lead to cirrhosis and liver failure. Encourage routine testing; recommend repeat testing if risk factors change.  

## 2022-06-22 NOTE — Assessment & Plan Note (Signed)
In mother Patient denies breathing or allergic concerns LCTAB

## 2022-06-22 NOTE — Assessment & Plan Note (Signed)
Agreeable to colo guard; previously agreeable to colonoscopy then reported when she got to procedure staff was very unprofessional  Denies family or personal risk factors that would put her in high risk category

## 2022-06-22 NOTE — Patient Instructions (Signed)
The CDC recommends two doses of Shingrix (the new shingles vaccine) separated by 2 to 6 months for adults age 64 years and older. I recommend checking with your insurance plan regarding coverage for this vaccine.    

## 2022-06-22 NOTE — Progress Notes (Signed)
  I,Roshena L Chambers,acting as a scribe for Elise T Payne, [name].,have documented all relevant documentation on the behalf of Elise T Payne, [name],as directed by  Elise T Payne, [name] while in the presence of Elise T Payne, [name].   New patient visit   Patient: Bridget Pollard   DOB: 01/15/1956   66 y.o. Female  MRN: [id number] Visit Date: 06/22/2022  Today's healthcare provider: Elise T Payne, [name]  Patient presents for new patient visit to establish care.  Introduced to nurse practitioner role and practice setting.  All questions answered.  Discussed provider/patient relationship and expectations.   Chief Complaint  Patient presents with   Establish Care   Hip Pain   Subjective    Bridget Pollard is a 66 y.o. female who presents today as a new patient to establish care.   Hip Pain  Incident onset: 2 years ago. There was no injury mechanism. The pain is present in the right hip. The quality of the pain is described as aching, shooting and stabbing. Pertinent negatives include no numbness.   Patient was seen by [facility] within the past year for hip pain.   History reviewed. No pertinent past medical history. Past Surgical History:  Procedure Laterality Date   COSMETIC SURGERY     Neck Lift   MOUTH SURGERY     SHOULDER SURGERY Left 1981   Family Status  Relation Name Status   Mother  Deceased at age 83   Father  Deceased at age 83       MI   Sister  Alive   Brother  Alive   Brother  Deceased at age 4 months old       atrial septal defect   Family History  Problem Relation Age of Onset   COPD Mother    Stroke Father    Prostate cancer Father    Heart attack Father    Skin cancer Father    Hypertension Sister    Skin cancer Sister    Diabetes Brother    Heart disease Brother    Social History   Socioeconomic History   Marital status: Married    Spouse name: Not on file   Number of children: 0   Years of education: PhD   Highest education level: Not on file   Occupational History   Occupation: Full Time Teacher  Tobacco Use   Smoking status: Former    Types: Cigarettes    Quit date: 11/28/1979    Years since quitting: 42.5   Smokeless tobacco: Never  Substance and Sexual Activity   Alcohol use: Yes    Alcohol/week: 2.0 standard drinks of alcohol    Types: 2 Glasses of wine per week    Comment: occasional   Drug use: No   Sexual activity: Not on file  Other Topics Concern   Not on file  Social History Narrative   Not on file   Social Determinants of Health   Financial Resource Strain: Not on file  Food Insecurity: Not on file  Transportation Needs: Not on file  Physical Activity: Not on file  Stress: Not on file  Social Connections: Not on file   Outpatient Medications Prior to Visit  Medication Sig   [DISCONTINUED] benzonatate (TESSALON PERLES) 100 MG capsule Take 1 capsule (100 mg total) by mouth 3 (three) times daily as needed for cough.   [DISCONTINUED] ciprofloxacin (CILOXAN) 0.3 % ophthalmic solution Place 1 drop into the right eye every 2 (two)   hours. Administer 1 drop, every 2 hours, while awake, for 2 days. Then 1 drop, every 4 hours, while awake, for the next 5 days.   [DISCONTINUED] Dextromethorphan Polistirex (DELSYM PO) Take by mouth. (Patient not taking: No sig reported)   No facility-administered medications prior to visit.   Allergies  Allergen Reactions   Penicillins Hives    Immunization History  Administered Date(s) Administered   Hepatitis A 03/25/2015, 01/21/2016   Hepatitis B 03/25/2015, 01/21/2016, 11/17/2017   Hepatitis B, adult 11/17/2017   Influenza,inj,Quad PF,6+ Mos 08/07/2019   Influenza-Unspecified 09/19/2017, 09/18/2018, 08/05/2020   MMR 09/24/2018   Td 12/16/2005   Tdap 07/20/2016   Typhoid Live 04/13/2015    Health Maintenance  Topic Date Due   COVID-19 Vaccine (1) Never done   Hepatitis C Screening  Never done   COLONOSCOPY (Pts 45-49yrs Insurance coverage will need to be  confirmed)  Never done   Zoster Vaccines- Shingrix (1 of 2) Never done   PAP SMEAR-Modifier  05/03/2016   INFLUENZA VACCINE  06/28/2022   MAMMOGRAM  09/15/2023   TETANUS/TDAP  07/20/2026   HIV Screening  Completed   HPV VACCINES  Aged Out    Patient Care Team: Payne, Elise T, [name] as PCP - General (Family Medicine)  Review of Systems  Constitutional:  Positive for activity change and unexpected weight change. Negative for chills, fatigue and fever.  HENT:  Negative for congestion, ear pain, rhinorrhea, sneezing and sore throat.   Eyes: Negative.  Negative for pain and redness.  Respiratory:  Negative for cough, shortness of breath and wheezing.   Cardiovascular:  Negative for chest pain and leg swelling.  Gastrointestinal:  Positive for abdominal distention and constipation. Negative for abdominal pain, blood in stool, diarrhea and nausea.  Endocrine: Positive for heat intolerance. Negative for polydipsia and polyphagia.  Genitourinary: Negative.  Negative for dysuria, flank pain, hematuria, pelvic pain, vaginal bleeding and vaginal discharge.  Musculoskeletal:  Positive for arthralgias, back pain, gait problem and myalgias. Negative for joint swelling.  Skin:  Negative for rash.  Neurological:  Negative for dizziness, tremors, seizures, weakness, light-headedness, numbness and headaches.  Hematological:  Negative for adenopathy.  Psychiatric/Behavioral: Negative.  Negative for behavioral problems, confusion and dysphoric mood. The patient is not nervous/anxious and is not hyperactive.        Objective    BP 118/77 (BP Location: Right Arm, Patient Position: Sitting, Cuff Size: Normal)   Pulse 70   Temp 98.5 F (36.9 C) (Oral)   Ht 5' 3" (1.6 m)   Wt 155 lb (70.3 kg)   SpO2 96% Comment: room air  BMI 27.46 kg/m    Physical Exam Vitals and nursing note reviewed.  Constitutional:      General: She is not in acute distress.    Appearance: Normal appearance. She is  overweight. She is not ill-appearing, toxic-appearing or diaphoretic.  HENT:     Head: Normocephalic and atraumatic.  Cardiovascular:     Rate and Rhythm: Normal rate and regular rhythm.     Pulses: Normal pulses.     Heart sounds: Normal heart sounds. No murmur heard.    No friction rub. No gallop.  Pulmonary:     Effort: Pulmonary effort is normal. No respiratory distress.     Breath sounds: Normal breath sounds. No stridor. No wheezing, rhonchi or rales.  Chest:     Chest wall: No tenderness.  Abdominal:     General: Bowel sounds are normal.     Palpations:   Abdomen is soft.  Musculoskeletal:        General: Tenderness present. No swelling, deformity or signs of injury. Normal range of motion.     Right hip: Tenderness present. Decreased strength.     Right lower leg: No edema.     Left lower leg: No edema.       Legs:  Skin:    General: Skin is warm and dry.     Capillary Refill: Capillary refill takes less than 2 seconds.     Coloration: Skin is not jaundiced or pale.     Findings: No bruising, erythema, lesion or rash.  Neurological:     General: No focal deficit present.     Mental Status: She is alert and oriented to person, place, and time. Mental status is at baseline.     Cranial Nerves: No cranial nerve deficit.     Sensory: No sensory deficit.     Motor: Weakness present.     Coordination: Coordination normal.     Gait: Gait abnormal.     Comments: + SLRaise on R leg when self lowering   Psychiatric:        Mood and Affect: Mood normal.        Behavior: Behavior normal.        Thought Content: Thought content normal.        Judgment: Judgment normal.     Depression Screen    06/22/2022    8:38 AM  PHQ 2/9 Scores  PHQ - 2 Score 1  PHQ- 9 Score 5   No results found for any visits on 06/22/22.  Assessment & Plan      Problem List Items Addressed This Visit       Endocrine   Hyperprolactinemia (HCC)    Chronic, stable Repeat prolactin Post  menopausal without break through bleeding       Relevant Orders   CBC with Differential/Platelet   TSH + free T4   Prolactin     Other   Colon cancer screening    Agreeable to colo guard; previously agreeable to colonoscopy then reported when she got to procedure staff was very unprofessional  Denies family or personal risk factors that would put her in high risk category       Relevant Orders   Cologuard   Encounter for hepatitis C screening test for low risk patient    Low risk screen Treatable, and curable. If left untreated Hep C can lead to cirrhosis and liver failure. Encourage routine testing; recommend repeat testing if risk factors change.       Relevant Orders   Hepatitis C Antibody   Encounter for screening for HIV    Low risk screen Consented; encouraged to "know your status" Recommend repeat screen if risk factors change       Relevant Orders   HIV antibody (with reflex)   Family history of COPD (chronic obstructive pulmonary disease)    In mother Patient denies breathing or allergic concerns LCTAB      Family history of diabetes mellitus (DM)    In brother Check A1c Continue to recommend balanced, lower carb meals. Smaller meal size, adding snacks. Choosing water as drink of choice and increasing purposeful exercise.       Relevant Orders   Hemoglobin A1c   Family history of essential hypertension    Seen in multiple family members Patient with normal BP despite pain in R hip of 4/10 today in office         Relevant Orders   Comprehensive Metabolic Panel (CMET)   Lipid panel   Family history of MI (myocardial infarction)    Seen in father; ultimately cause of passing       Relevant Orders   Comprehensive Metabolic Panel (CMET)   Lipid panel   Former cigarette smoker    Declines low dose CT for lung cancer screening; reports cigarette use >40 years ago for 1 ppd for <5 years      Mixed hyperlipidemia    Chronic, previously elevated Body  mass index is 27.46 kg/m. Normal BP 118/77 Family hx of MI and CVA Repeat LP recommend diet low in saturated fat and regular exercise - 30 min at least 5 times per week       Relevant Orders   Comprehensive Metabolic Panel (CMET)   Lipid panel   Pain of right hip - Primary    Chronic, variable, can fluctuate within the day Exacerbated by posturing including leaning and bending or getting in and out of car Reports being seen at [facility]; unable to pull records on Care Everywhere No bursa involvement Occasional ataxic joint d/t pain Slight improvement with 2 week trial of naproxen  Recommend use of mobic, robaxin, and prednisone to assist F/u with ortho/sports medicine if needed       Relevant Medications   meloxicam (MOBIC) 15 MG tablet   methocarbamol (ROBAXIN-750) 750 MG tablet   predniSONE (DELTASONE) 10 MG tablet   Other Relevant Orders   C-reactive protein   Sed Rate (ESR)   Lyme Disease Serology w/Reflex   Rocky mtn spotted fvr abs pnl(IgG+IgM)   Ambulatory referral to Sports Medicine   Positive depression screening    Acute, stable No concern for depression or anxiety on exam Patient did not express any concerns Continue to monitor        Return in about 6 months (around 12/23/2022).     Vonna Kotyk, [name], have reviewed all documentation for this visit. The documentation on 06/22/22 for the exam, diagnosis, procedures, and orders are all accurate and complete.    Gwyneth Sprout, [place] [phone] (phone) [phone] (fax)  [place]

## 2022-06-23 NOTE — Progress Notes (Signed)
Negative Lyme; RMSF pending

## 2022-06-23 NOTE — Progress Notes (Signed)
Hi Kearstin,  It was a pleasure to meet you in the office the other day.  Blood chemistry is stable. Borderline elevation in one liver enzyme. Continue to focus on lower fat diet, and exercise to assist. We know use of NSAIDs, like the Mobic, naproxen, can elevation.   Cholesterol is elevated. Fats elevated in blood as well in additional to bad/LDL cholesterol. Recommend use of statin to assist in stroke/heart attack prevention. Borderline risk at 5% in 10 years. Please let us know if you would like to start. The 10-year ASCVD risk score (Arnett DK, et al., 2019) is: 5.1%   Values used to calculate the score:     Age: 66 years     Sex: Female     Is Non-Hispanic African American: No     Diabetic: No     Tobacco smoker: No     Systolic Blood Pressure: 118 mmHg     Is BP treated: No     HDL Cholesterol: 43 mg/dL     Total Cholesterol: 224 mg/dL  I4P shows prediabetes. Can start metformin if desired to assist. Otherwise- Continue to recommend balanced, lower carb meals. Smaller meal size, adding snacks. Choosing water as drink of choice and increasing purposeful exercise.  Prolactin remains elevated; however, downtrend from previous by 15 pts.  Normal cell count, normal thyroid, negative infectious disease.  Negative inflammatory. Lyme and RMSF pending.  Please let us know if you have any questions.  Thank you, [name], [name]  [facility] [address] [place], [place] [address] [phone] (phone) [phone] (fax) [facility]

## 2022-06-24 LAB — CBC WITH DIFFERENTIAL/PLATELET
Basophils Absolute: 0 10*3/uL (ref 0.0–0.2)
Basos: 1 %
EOS (ABSOLUTE): 0.1 10*3/uL (ref 0.0–0.4)
Eos: 2 %
Hematocrit: 38.3 % (ref 34.0–46.6)
Hemoglobin: 13.1 g/dL (ref 11.1–15.9)
Immature Grans (Abs): 0 10*3/uL (ref 0.0–0.1)
Immature Granulocytes: 0 %
Lymphocytes Absolute: 1.9 10*3/uL (ref 0.7–3.1)
Lymphs: 36 %
MCH: 31.1 pg (ref 26.6–33.0)
MCHC: 34.2 g/dL (ref 31.5–35.7)
MCV: 91 fL (ref 79–97)
Monocytes Absolute: 0.5 10*3/uL (ref 0.1–0.9)
Monocytes: 10 %
Neutrophils Absolute: 2.6 10*3/uL (ref 1.4–7.0)
Neutrophils: 51 %
Platelets: 297 10*3/uL (ref 150–450)
RBC: 4.21 x10E6/uL (ref 3.77–5.28)
RDW: 12.1 % (ref 11.7–15.4)
WBC: 5.2 10*3/uL (ref 3.4–10.8)

## 2022-06-24 LAB — COMPREHENSIVE METABOLIC PANEL
ALT: 22 IU/L (ref 0–32)
AST: 23 IU/L (ref 0–40)
Albumin/Globulin Ratio: 1.9 (ref 1.2–2.2)
Albumin: 4.6 g/dL (ref 3.9–4.9)
Alkaline Phosphatase: 126 IU/L — ABNORMAL HIGH (ref 44–121)
BUN/Creatinine Ratio: 20 (ref 12–28)
BUN: 15 mg/dL (ref 8–27)
Bilirubin Total: 0.3 mg/dL (ref 0.0–1.2)
CO2: 23 mmol/L (ref 20–29)
Calcium: 9.7 mg/dL (ref 8.7–10.3)
Chloride: 104 mmol/L (ref 96–106)
Creatinine, Ser: 0.76 mg/dL (ref 0.57–1.00)
Globulin, Total: 2.4 g/dL (ref 1.5–4.5)
Glucose: 95 mg/dL (ref 70–99)
Potassium: 4.7 mmol/L (ref 3.5–5.2)
Sodium: 141 mmol/L (ref 134–144)
Total Protein: 7 g/dL (ref 6.0–8.5)
eGFR: 87 mL/min/{1.73_m2} (ref >59)

## 2022-06-24 LAB — SEDIMENTATION RATE: Sed Rate: 12 mm/hr (ref 0–40)

## 2022-06-24 LAB — LIPID PANEL
Chol/HDL Ratio: 5.2 ratio — ABNORMAL HIGH (ref 0.0–4.4)
Cholesterol, Total: 224 mg/dL — ABNORMAL HIGH (ref 100–199)
HDL: 43 mg/dL (ref >39)
LDL Chol Calc (NIH): 153 mg/dL — ABNORMAL HIGH (ref 0–99)
Triglycerides: 153 mg/dL — ABNORMAL HIGH (ref 0–149)
VLDL Cholesterol Cal: 28 mg/dL (ref 5–40)

## 2022-06-24 LAB — HIV ANTIBODY (ROUTINE TESTING W REFLEX): HIV Screen 4th Generation wRfx: NONREACTIVE

## 2022-06-24 LAB — TSH+FREE T4
Free T4: 1.19 ng/dL (ref 0.82–1.77)
TSH: 1.6 u[IU]/mL (ref 0.450–4.500)

## 2022-06-24 LAB — C-REACTIVE PROTEIN: CRP: 2 mg/L (ref 0–10)

## 2022-06-24 LAB — HEPATITIS C ANTIBODY: Hep C Virus Ab: NONREACTIVE

## 2022-06-24 LAB — HEMOGLOBIN A1C
Est. average glucose Bld gHb Est-mCnc: 117 mg/dL
Hgb A1c MFr Bld: 5.7 % — ABNORMAL HIGH (ref 4.8–5.6)

## 2022-06-24 LAB — ROCKY MTN SPOTTED FVR ABS PNL(IGG+IGM)
RMSF IgG: NEGATIVE
RMSF IgM: 1.17 index — ABNORMAL HIGH (ref 0.00–0.89)

## 2022-06-24 LAB — PROLACTIN: Prolactin: 83.1 ng/mL — ABNORMAL HIGH (ref 4.8–23.3)

## 2022-06-24 LAB — LYME DISEASE SEROLOGY W/REFLEX: Lyme Total Antibody EIA: NEGATIVE

## 2022-06-27 NOTE — Progress Notes (Signed)
Recommend repeat RMSF if patient has been bit 3 weeks prior to initial lab testing. However, if she was not- presumed negative given only one immunoglobin positive without fever or rash. Both IgG and IgM need to be positive to assume relation to hip pain.  Please let us know if you have any questions.  Thank you, [name], [name]  [facility] [address] [place], [place] [address] [phone] (phone) [phone] (fax) [facility]

## 2022-06-28 ENCOUNTER — Encounter: Payer: Self-pay | Admitting: Family Medicine

## 2022-06-28 ENCOUNTER — Ambulatory Visit (INDEPENDENT_AMBULATORY_CARE_PROVIDER_SITE_OTHER): Payer: BC Managed Care – PPO | Admitting: Family Medicine

## 2022-06-28 VITALS — BP 126/60 | Ht 63.0 in | Wt 150.0 lb

## 2022-06-28 DIAGNOSIS — M25551 Pain in right hip: Secondary | ICD-10-CM | POA: Diagnosis not present

## 2022-06-28 DIAGNOSIS — R7309 Other abnormal glucose: Secondary | ICD-10-CM | POA: Diagnosis not present

## 2022-06-28 NOTE — Patient Instructions (Signed)
Get your x-rays on a CD and drop these off up front if possible - I'll call you with the results and next steps. Continue the prednisone until this is gone then take meloxicam, robaxin only if needed. Avoid full extent of stretching this hip - I suspect you have more arthritis than was seen on x-rays or femoroacetabular impingement based on the exam today. Further treatment/MRI dependent on how you doing going forward and the x-ray results.

## 2022-06-28 NOTE — Progress Notes (Addendum)
PCP: [name], [name]  Subjective:   HPI: Patient is a 66 y.o. female here for right hip pain.  Patient reports for over 2 years she's had pain in anterior right hip. She previously had pain in multiple other joints that has since resolved - concern was for possible tick-borne disease so had a workup for this that is still ongoing. She went to [facility] and had x-rays - told these were normal (was in past year) but they're not currently available for review. She was given some home exercises which sometimes help. No catching, locking. No numbness or tingling.  History reviewed. No pertinent past medical history.  Current Outpatient Medications on File Prior to Visit  Medication Sig Dispense Refill   meloxicam (MOBIC) 15 MG tablet Take 1 tablet (15 mg total) by mouth daily. 30 tablet 0   methocarbamol (ROBAXIN-750) 750 MG tablet Take 1 tablet (750 mg total) by mouth in the morning and at bedtime. 60 tablet 0   predniSONE (DELTASONE) 10 MG tablet Day 1 take 6 tablets Day 2 take 6 tablets Day 3 take 5 tablets Day 4 take 5 tablets Day 5 take 4 tablets Day 6 take 4 tablets Day 7 take 3 tablets Day 8 take 3 tablets Day 9 take 2 tablets Day 10 take 2 tablets Day 11 take 1 tablet Day 12 take 1 tablet Day 13 take 1/2 tablet Day 14 take 1/2 tablet 44 tablet 0   No current facility-administered medications on file prior to visit.    Past Surgical History:  Procedure Laterality Date   COSMETIC SURGERY     Neck Lift   MOUTH SURGERY     SHOULDER SURGERY Left 1981    Allergies  Allergen Reactions   Penicillins Hives    BP 126/60   Ht 5\' 3"  (1.6 m)   Wt 150 lb (68 kg)   BMI 26.57 kg/m       No data to display              No data to display              Objective:  Physical Exam:  Gen: NAD, comfortable in exam room  Back: No gross deformity, scoliosis. No midline or bony TTP. Strength LEs 5/5 all muscle groups.   3+ MSRs in patellar and achilles tendons,  equal bilaterally. Negative SLRs. Sensation intact to light touch bilaterally.  Right hip: No deformity. Mildly diminished IR and ER with pain on extents of motion.  5/5 strength extremity. No tenderness to palpation greater trochanter, piriformis, SI joint. NVI distally. Positive logroll Negative faber but decreased motion compared to left.  Positive fadir.  Negative piriformis stretch. Assessment & Plan:  1. 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  Outside radiographs not available - she will try to get them on a CD for me to review.  She is doing better on prednisone, meloxicam, robaxin so will continue with this.  Avoid full extent of motion/stretching.  Her husband is a PTA and she is aware of home exercises provided previously and has been doing these.  Addendum:  XRs reviewed and discussed with patient - only mild arthritis on plan radiographs.  She had repeat RMSF bloodwork yesterday so awaint these results.  Continue medications as above.  If not improving would consider MRI of her hip as next step.

## 2022-06-30 ENCOUNTER — Other Ambulatory Visit: Payer: Self-pay

## 2022-06-30 DIAGNOSIS — M25551 Pain in right hip: Secondary | ICD-10-CM

## 2022-07-01 DIAGNOSIS — M25551 Pain in right hip: Secondary | ICD-10-CM | POA: Diagnosis not present

## 2022-07-04 DIAGNOSIS — Z1211 Encounter for screening for malignant neoplasm of colon: Secondary | ICD-10-CM | POA: Diagnosis not present

## 2022-07-05 LAB — ROCKY MTN SPOTTED FVR ABS PNL(IGG+IGM)
RMSF IgG: NEGATIVE
RMSF IgM: 0.99 index — ABNORMAL HIGH (ref 0.00–0.89)

## 2022-07-05 NOTE — Progress Notes (Signed)
Labs are improving; continues to show negative panel for IgG.  [name], [name]  [facility] [address] [place], [place] [address] [phone] (phone) [phone] (fax) [facility]

## 2022-07-12 LAB — COLOGUARD: COLOGUARD: NEGATIVE

## 2022-07-13 NOTE — Progress Notes (Signed)
Negative screening for colon cancer; recommend repeat in 3 years if desired or traditional colonoscopy.  [name], [name]  [facility] [address] [place], [place] [address] [phone] (phone) [phone] (fax) [facility]

## 2022-07-14 ENCOUNTER — Other Ambulatory Visit: Payer: Self-pay | Admitting: Family Medicine

## 2022-07-14 DIAGNOSIS — M25551 Pain in right hip: Secondary | ICD-10-CM

## 2022-07-14 NOTE — Telephone Encounter (Signed)
Copied from CRM [id number]. Topic: General - Other >> Jul 14, 2022  4:18 PM Everette C wrote: Reason for CRM: Medication Refill - Medication: methocarbamol (ROBAXIN-750) 750 MG tablet [[id number]]   meloxicam (MOBIC) 15 MG tablet [[id number]]   Has the patient contacted their pharmacy? Yes.  The patient has been directed to contact their PCP  (Agent: If no, request that the patient contact the pharmacy for the refill. If patient does not wish to contact the pharmacy document the reason why and proceed with request.) (Agent: If yes, when and what did the pharmacy advise?)  Preferred Pharmacy (with phone number or street name): [facility] [address], [place] - [address] [place] [address] Phone: [phone] Fax: [phone] Hours: Not open 24 hours   Has the patient been seen for an appointment in the last year OR does the patient have an upcoming appointment? Yes.    Agent: Please be advised that RX refills may take up to 3 business days. We ask that you follow-up with your pharmacy.

## 2022-07-15 MED ORDER — METHOCARBAMOL 750 MG PO TABS: 750.0000 mg | ORAL_TABLET | Freq: Two times a day (BID) | ORAL | 1 refills | Status: DC

## 2022-07-15 MED ORDER — MELOXICAM 15 MG PO TABS: 15.0000 mg | ORAL_TABLET | Freq: Every day | ORAL | 1 refills | Status: DC

## 2022-07-15 NOTE — Telephone Encounter (Signed)
Requested medication (s) are due for refill today: yes  Requested medication (s) are on the active medication list: yes  Last refill:  06/22/22 #60/0  Future visit scheduled: yes  Notes to clinic:  Unable to refill per protocol, cannot delegate.    Requested Prescriptions  Pending Prescriptions Disp Refills   methocarbamol (ROBAXIN-750) 750 MG tablet 60 tablet 0    Sig: Take 1 tablet (750 mg total) by mouth in the morning and at bedtime.     Not Delegated - Analgesics:  Muscle Relaxants Failed - 07/14/2022  5:09 PM      Failed - This refill cannot be delegated      Passed - Valid encounter within last 6 months    Recent Outpatient Visits           3 weeks ago Pain of right hip   [facility] [name], [name]   6 years ago Encounter for biometric screening   [facility] Rosezetta Schlatter, [name], [name]   6 years ago Annual physical exam   [facility] [name], [place]   6 years ago Cough   [facility] [name], MD   6 years ago Bilateral low back pain without sciatica   [facility] [name], MD       Future Appointments             In 4 months [name], [name] [facility]

## 2022-07-29 DIAGNOSIS — R7309 Other abnormal glucose: Secondary | ICD-10-CM | POA: Diagnosis not present

## 2022-08-28 DIAGNOSIS — R7309 Other abnormal glucose: Secondary | ICD-10-CM | POA: Diagnosis not present

## 2022-09-28 DIAGNOSIS — R7309 Other abnormal glucose: Secondary | ICD-10-CM | POA: Diagnosis not present

## 2022-10-28 DIAGNOSIS — R7309 Other abnormal glucose: Secondary | ICD-10-CM | POA: Diagnosis not present

## 2022-11-28 DIAGNOSIS — R7309 Other abnormal glucose: Secondary | ICD-10-CM | POA: Diagnosis not present

## 2022-12-02 ENCOUNTER — Ambulatory Visit (INDEPENDENT_AMBULATORY_CARE_PROVIDER_SITE_OTHER): Payer: BC Managed Care – PPO | Admitting: Family Medicine

## 2022-12-02 ENCOUNTER — Encounter: Payer: Self-pay | Admitting: Family Medicine

## 2022-12-02 VITALS — BP 113/75 | HR 71 | Temp 97.8°F | Resp 16 | Wt 150.8 lb

## 2022-12-02 DIAGNOSIS — G8929 Other chronic pain: Secondary | ICD-10-CM

## 2022-12-02 DIAGNOSIS — M25551 Pain in right hip: Secondary | ICD-10-CM

## 2022-12-02 NOTE — Progress Notes (Signed)
I,Joseline E Rosas,acting as a scribe for Gwyneth Sprout, [name].,have documented all relevant documentation on the behalf of Gwyneth Sprout, [name],as directed by  Gwyneth Sprout, [name] while in the presence of Gwyneth Sprout, [name].   Established patient visit   Patient: Bridget Pollard   DOB: [birth year]   67 y.o. Female  MRN: [id number] Visit Date: 12/02/2022  Today's healthcare provider: Gwyneth Sprout, [name]  Introduced to nurse practitioner role and practice setting.  All questions answered.  Discussed provider/patient relationship and expectations.   Chief Complaint  Patient presents with   Hip Pain   Subjective    Hip Pain     Patient here to follow -up on hip pain. She has been seen for this before. Patient reports symptom worsening. Reports that she has done PT and imaging. She is taking the Meloxicam and Methocarbamol. Reports that when she stand up she needs to wait a little before walking otherwise feel like she is going to end on the floor.  Medications: Outpatient Medications Prior to Visit  Medication Sig   meloxicam (MOBIC) 15 MG tablet Take 1 tablet (15 mg total) by mouth daily.   methocarbamol (ROBAXIN-750) 750 MG tablet Take 1 tablet (750 mg total) by mouth in the morning and at bedtime.   [DISCONTINUED] predniSONE (DELTASONE) 10 MG tablet Day 1 take 6 tablets Day 2 take 6 tablets Day 3 take 5 tablets Day 4 take 5 tablets Day 5 take 4 tablets Day 6 take 4 tablets Day 7 take 3 tablets Day 8 take 3 tablets Day 9 take 2 tablets Day 10 take 2 tablets Day 11 take 1 tablet Day 12 take 1 tablet Day 13 take 1/2 tablet Day 14 take 1/2 tablet   No facility-administered medications prior to visit.    Review of Systems  Constitutional:  Positive for activity change.  Eyes:  Positive for discharge and redness.  Musculoskeletal:  Positive for arthralgias, back pain, gait problem and myalgias.  All other systems reviewed and are negative.    Objective    BP 113/75 (BP Location: Right  Arm, Patient Position: Sitting, Cuff Size: Normal)   Pulse 71   Temp 97.8 F (36.6 C) (Oral)   Resp 16   Wt 150 lb 12.8 oz (68.4 kg)   BMI 26.71 kg/m  Physical Exam Vitals and nursing note reviewed.  Constitutional:      General: She is not in acute distress.    Appearance: Normal appearance. She is overweight. She is not ill-appearing, toxic-appearing or diaphoretic.  HENT:     Head: Normocephalic and atraumatic.  Cardiovascular:     Rate and Rhythm: Normal rate and regular rhythm.     Pulses: Normal pulses.     Heart sounds: Normal heart sounds. No murmur heard.    No friction rub. No gallop.  Pulmonary:     Effort: Pulmonary effort is normal. No respiratory distress.     Breath sounds: Normal breath sounds. No stridor. No wheezing, rhonchi or rales.  Chest:     Chest wall: No tenderness.  Musculoskeletal:        General: No swelling, tenderness, deformity or signs of injury. Normal range of motion.     Right lower leg: No edema.     Left lower leg: No edema.  Skin:    General: Skin is warm and dry.     Capillary Refill: Capillary refill takes less than 2 seconds.     Coloration:  Skin is not jaundiced or pale.     Findings: No bruising, erythema, lesion or rash.  Neurological:     General: No focal deficit present.     Mental Status: She is alert and oriented to person, place, and time. Mental status is at baseline.     Cranial Nerves: No cranial nerve deficit.     Sensory: No sensory deficit.     Motor: Weakness present.     Coordination: Coordination normal.     Gait: Gait abnormal.  Psychiatric:        Mood and Affect: Mood normal.        Behavior: Behavior normal.        Thought Content: Thought content normal.        Judgment: Judgment normal.       No results found for any visits on 12/02/22.  Assessment & Plan     Problem List Items Addressed This Visit       Other   Chronic right hip pain - Primary    Acute on chronic, now reports nerve pain going  down both front and back of R leg to knee Has noted knee giving out as well as muscle wasting from favoring her L leg Also notes improvement with both Mobic 15 and Robaxin 750 mg BID to assist Declines use of additional steroids to assist during this flare Was seen by Sport Medicine before 4 month sabbatical to [place] with [employer] Does not wish to do a hip replacement; however, wishes to address the hip by specialists for options given acute on chronic, worsening nature and feelings of possible depression with change in activity and mobility status Referrals sent; refills stable.      Relevant Orders   Ambulatory referral to Orthopedic Surgery   Return if symptoms worsen or fail to improve, for annual examination.     Vonna Kotyk, [name], have reviewed all documentation for this visit. The documentation on 12/02/22 for the exam, diagnosis, procedures, and orders are all accurate and complete.  Gwyneth Sprout, [place] [phone] (phone) [phone] (fax)  [place]

## 2022-12-02 NOTE — Assessment & Plan Note (Signed)
Acute on chronic, now reports nerve pain going down both front and back of R leg to knee Has noted knee giving out as well as muscle wasting from favoring her L leg Also notes improvement with both Mobic 15 and Robaxin 750 mg BID to assist Declines use of additional steroids to assist during this flare Was seen by Sport Medicine before 4 month sabbatical to [facility] with [employer] Does not wish to do a hip replacement; however, wishes to address the hip by specialists for options given acute on chronic, worsening nature and feelings of possible depression with change in activity and mobility status Referrals sent; refills stable.

## 2022-12-13 DIAGNOSIS — M1611 Unilateral primary osteoarthritis, right hip: Secondary | ICD-10-CM | POA: Diagnosis not present

## 2022-12-13 DIAGNOSIS — G8929 Other chronic pain: Secondary | ICD-10-CM | POA: Diagnosis not present

## 2022-12-13 DIAGNOSIS — M25551 Pain in right hip: Secondary | ICD-10-CM | POA: Diagnosis not present

## 2022-12-29 DIAGNOSIS — R7309 Other abnormal glucose: Secondary | ICD-10-CM | POA: Diagnosis not present

## 2022-12-30 IMAGING — MG MM DIGITAL SCREENING BILAT W/ TOMO AND CAD
6 of 10 series · 6 of 30 positions shown · non-contrast
Comparison: Previous exam(s).

CLINICAL DATA: Screening.

EXAM:
DIGITAL SCREENING BILATERAL MAMMOGRAM WITH TOMOSYNTHESIS AND CAD
TECHNIQUE: Bilateral screening digital craniocaudal and mediolateral oblique
mammograms were obtained. Bilateral screening digital breast
tomosynthesis was performed. The images were evaluated with
computer-aided detection.

[L CC synth-2D]
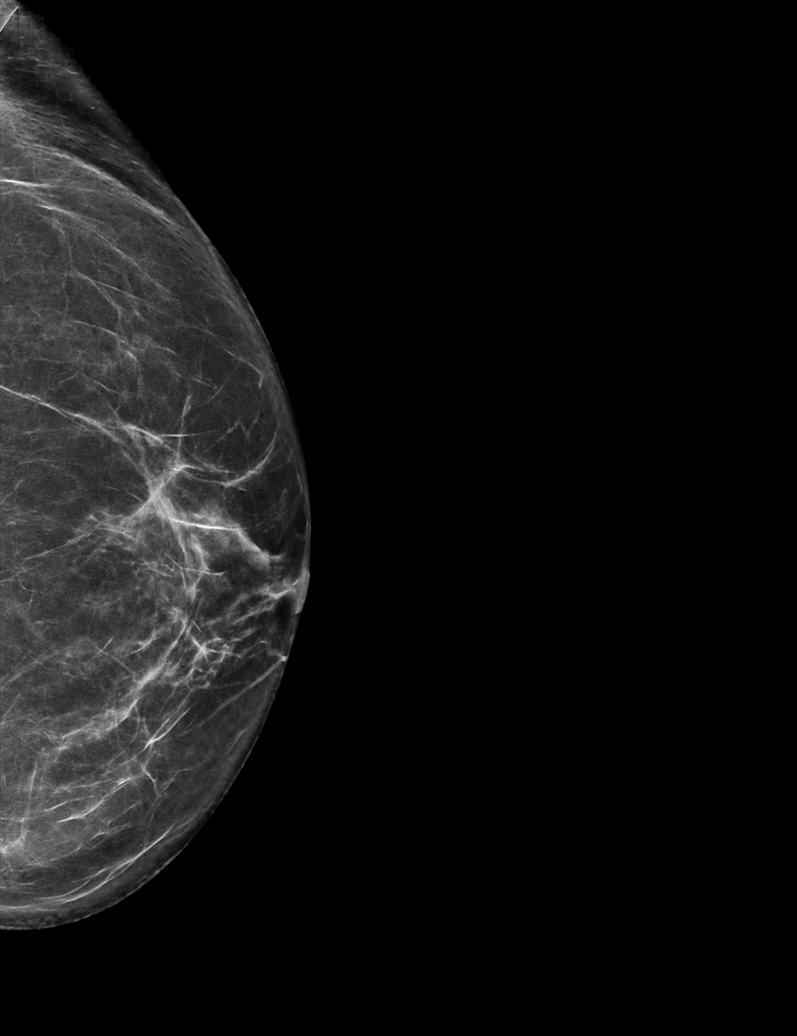

[R MLO synth-2D (1 of 2)]
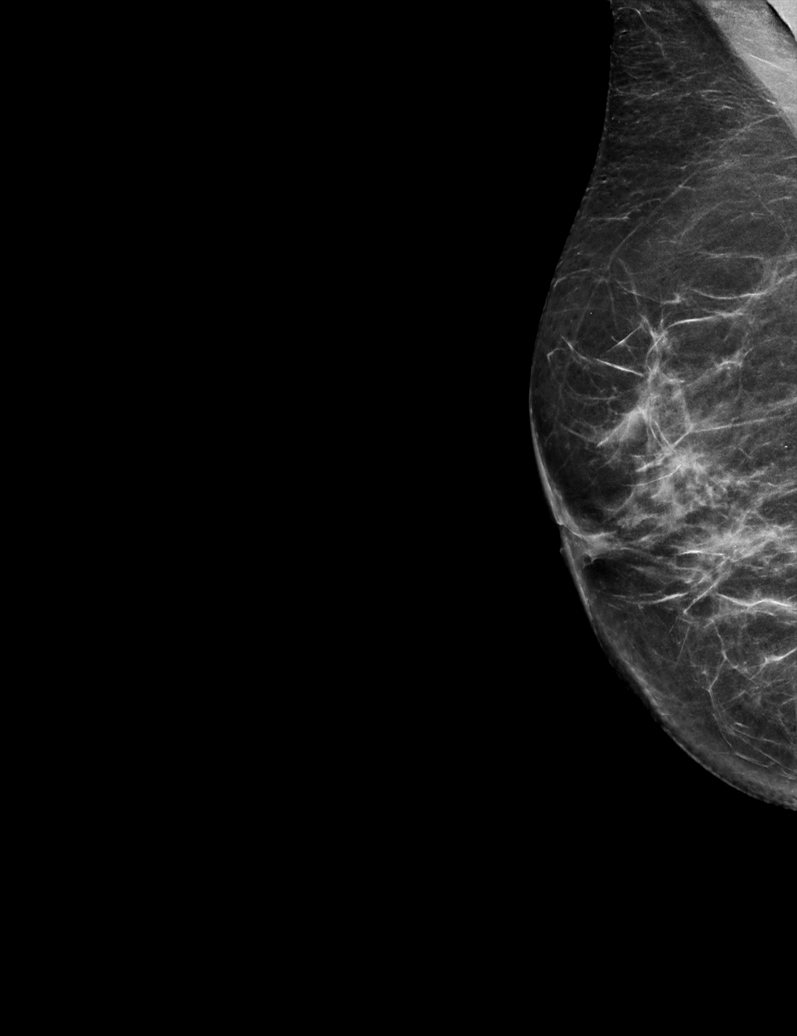

[L MLO synth-2D]
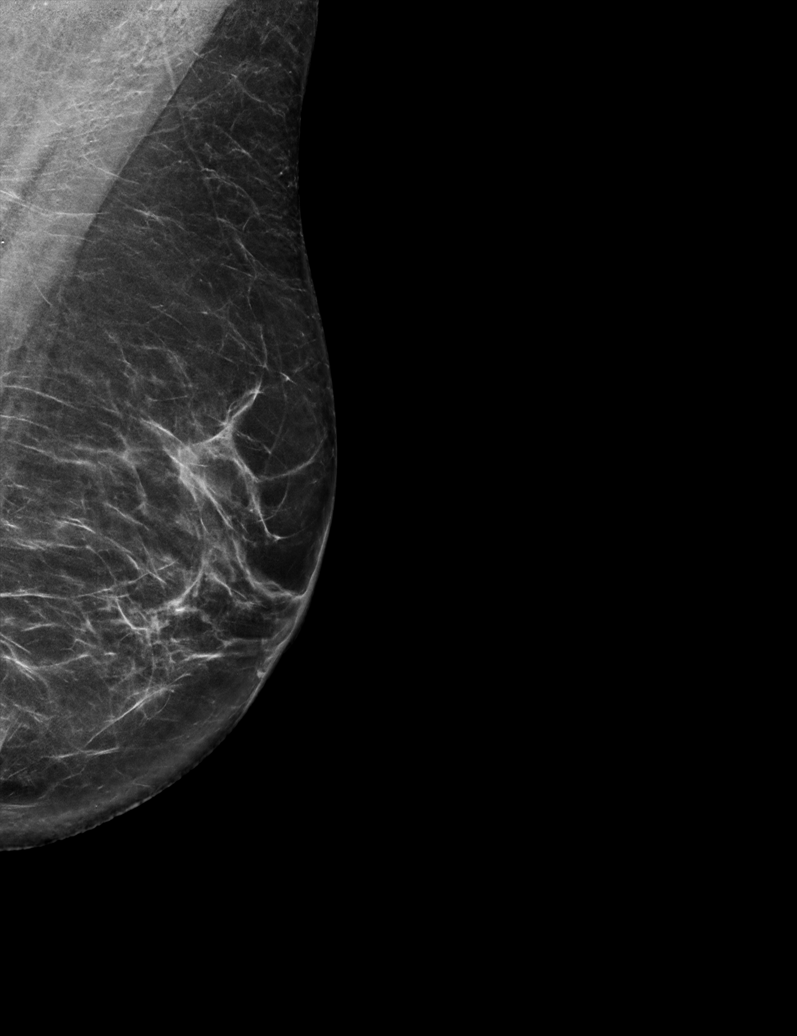

[R MLO synth-2D (2 of 2)]
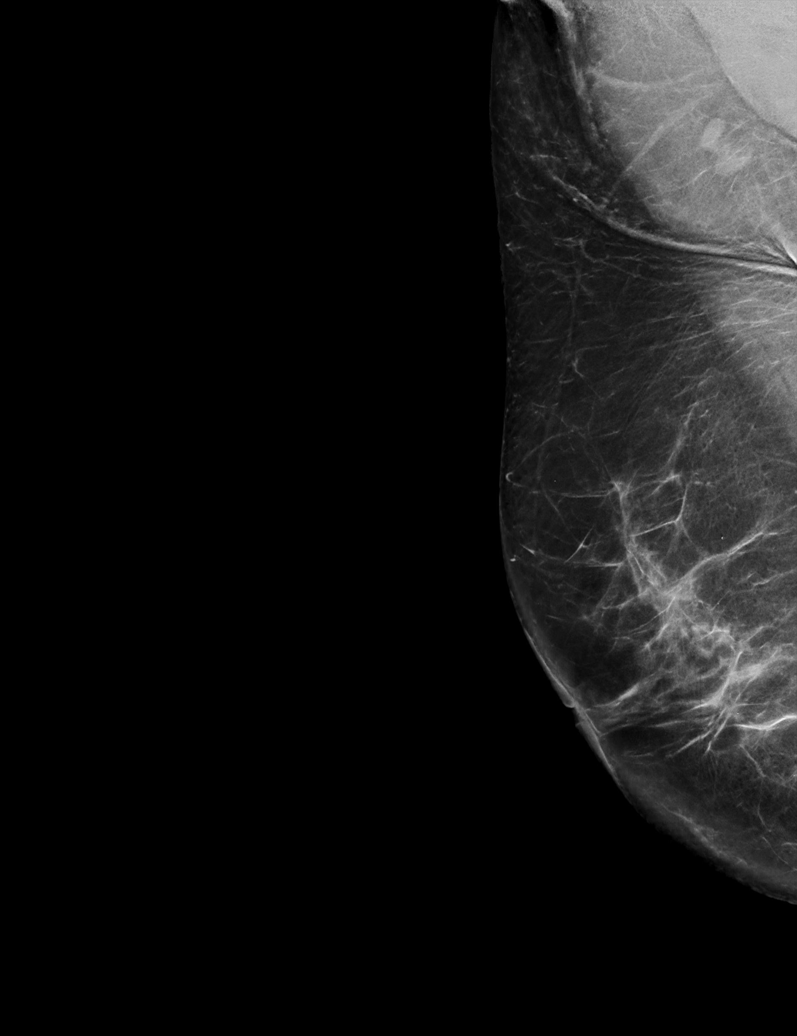

[R CC synth-2D]
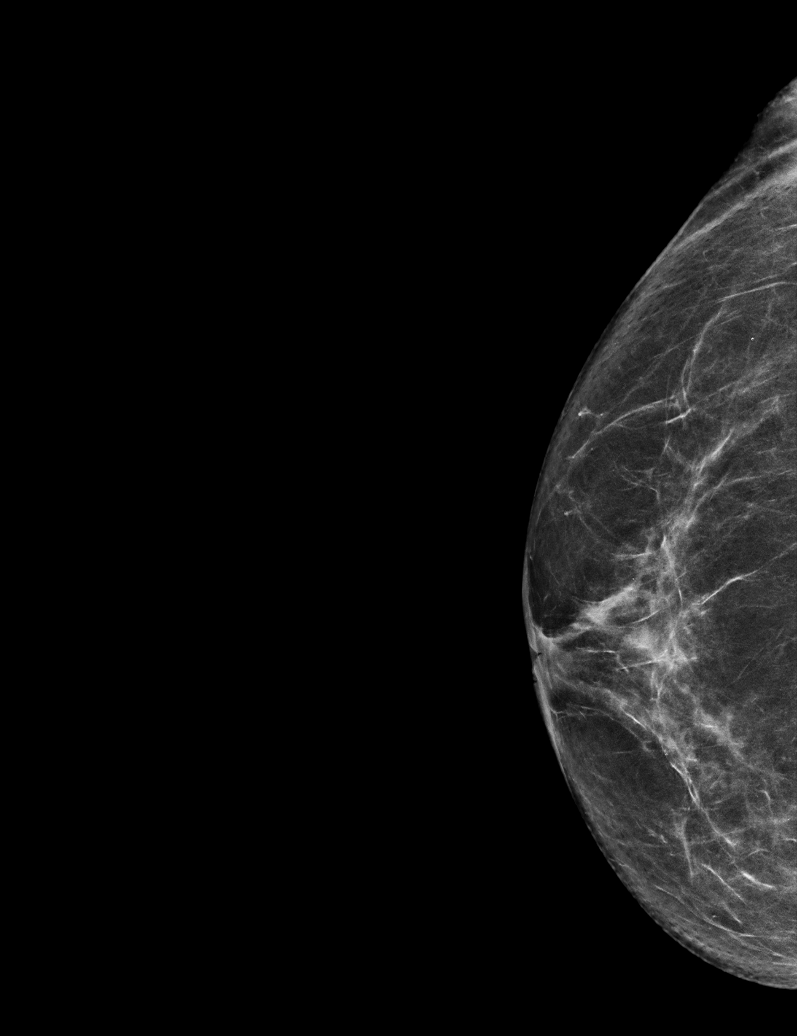

[R MLO tomo · tomo slice 32/63.0]
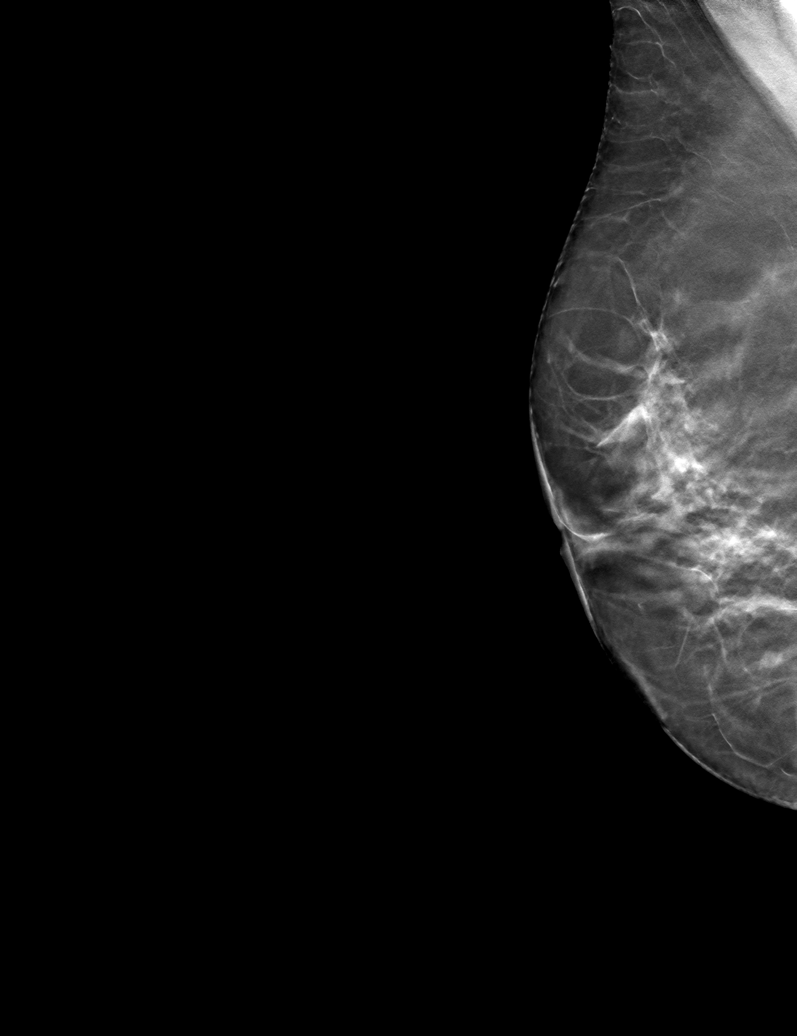

[6 of 30 positions shown; findings below may reference images not displayed]

ACR Breast Density Category b: There are scattered areas of
fibroglandular density.
FINDINGS: There are no findings suspicious for malignancy.
IMPRESSION: No mammographic evidence of malignancy. A result letter of this
screening mammogram will be mailed directly to the patient.

RECOMMENDATION:
Screening mammogram in one year. (Code:51-O-LD2)

BI-RADS CATEGORY  1: Negative.

## 2023-01-20 ENCOUNTER — Other Ambulatory Visit: Payer: Self-pay | Admitting: Family Medicine

## 2023-01-20 DIAGNOSIS — M25551 Pain in right hip: Secondary | ICD-10-CM

## 2023-01-27 DIAGNOSIS — R7309 Other abnormal glucose: Secondary | ICD-10-CM | POA: Diagnosis not present

## 2023-02-27 DIAGNOSIS — M169 Osteoarthritis of hip, unspecified: Secondary | ICD-10-CM

## 2023-02-27 DIAGNOSIS — R7309 Other abnormal glucose: Secondary | ICD-10-CM | POA: Diagnosis not present

## 2023-02-27 HISTORY — DX: Osteoarthritis of hip, unspecified: M16.9

## 2023-02-28 DIAGNOSIS — M1611 Unilateral primary osteoarthritis, right hip: Secondary | ICD-10-CM | POA: Diagnosis not present

## 2023-03-05 DIAGNOSIS — M1611 Unilateral primary osteoarthritis, right hip: Secondary | ICD-10-CM | POA: Insufficient documentation

## 2023-03-29 DIAGNOSIS — R7309 Other abnormal glucose: Secondary | ICD-10-CM | POA: Diagnosis not present

## 2023-04-02 NOTE — Discharge Instructions (Addendum)
Instructions after Total Hip Replacement     James P. Angie Fava., M.D.     Dept. of Orthopaedics & Sports Medicine  [facility]  [address]  [place], [place]  [address]  Phone: [phone]   Fax: [phone]    DIET: Drink plenty of non-alcoholic fluids. Resume your normal diet. Include foods high in fiber.  ACTIVITY:  You may use crutches or a walker with weight-bearing as tolerated, unless instructed otherwise. You may be weaned off of the walker or crutches by your Physical Therapist.  Do NOT reach below the level of your knees or cross your legs until allowed.    Continue doing gentle exercises. Exercising will reduce the pain and swelling, increase motion, and prevent muscle weakness.   Please continue to use the TED compression stockings for 6 weeks. You may remove the stockings at night, but should reapply them in the morning. Do not drive or operate any equipment until instructed.  WOUND CARE:  Continue to use ice packs periodically to reduce pain and swelling. Keep the incision clean and dry. You may bathe or shower after the staples are removed at the first office visit following surgery. The Aquacel bandage stays on the hip for 7 days postoperatively.  It can be changed by physical therapy at home to a honeycomb dressing after this period.  MEDICATIONS: You may resume your regular medications. Please take the pain medication as prescribed on the medication. Do not take pain medication on an empty stomach. You have been given a prescription for a blood thinner to prevent blood clots. Please take the medication as instructed. Take a baby aspirin (81mg ) two times a day, continue to wear TED hose on BOTH legs. Pain medications and iron supplements can cause constipation. Use a stool softener (Senokot or Colace) on a daily basis and a laxative (dulcolax or miralax) as needed. Do not drive or drink alcoholic beverages when taking pain medications.  CALL THE  OFFICE FOR: Temperature above 101 degrees Excessive bleeding or drainage on the dressing. Excessive swelling, coldness, or paleness of the toes. Persistent nausea and vomiting.  FOLLOW-UP:  You should have an appointment to return to the office in 6 weeks after surgery. Arrangements have been made for continuation of Physical Therapy (either home therapy or outpatient therapy).     [facility] Department Directory         [email or web address]       [email or web address]          Cardiology  Appointments: [place] - [phone]  Endocrinology  Appointments: [place] [phone] [place] - [phone]  Gastroenterology  Appointments: [place] [phone] [place] - [phone]        General Surgery   Appointments: [facility]  Internal Medicine/Family Medicine  Appointments: [facility] - [phone] [place] - [phone]  Metabolic and Weigh Loss Surgery  Appointments: [facility]        Neurology  Appointments: [place] [phone] [place] - [phone]  Neurosurgery  Appointments: [place]  Obstetrics & Gynecology  Appointments: [place] [phone] [place] - [phone]        Pediatrics  Appointments: [place] [phone] [place] - [phone]  Physiatry  Appointments: [place] [phone]  Physical Therapy  Appointments: [place] - [phone]        Podiatry  Appointments: [place] [phone] [place] - [phone]  Pulmonology  Appointments: [place]  Rheumatology  Appointments: [place] [phone]  [place] Location: [facility]  [address] [place], [place]  [address]  [place] Location: [facility]. [address] [place], [place]  [address]  [place] Location: [facility] [address] [place], [place]  [address]

## 2023-04-05 ENCOUNTER — Encounter
Admission: RE | Admit: 2023-04-05 | Discharge: 2023-04-05 | Disposition: A | Discharge status: Home / Self Care | Payer: BC Managed Care – PPO | Source: Ambulatory Visit | Attending: Orthopedic Surgery | Admitting: Orthopedic Surgery

## 2023-04-05 DIAGNOSIS — Z01818 Encounter for other preprocedural examination: Secondary | ICD-10-CM | POA: Diagnosis not present

## 2023-04-05 DIAGNOSIS — Z880 Allergy status to penicillin: Secondary | ICD-10-CM | POA: Diagnosis not present

## 2023-04-05 DIAGNOSIS — M1611 Unilateral primary osteoarthritis, right hip: Secondary | ICD-10-CM | POA: Diagnosis not present

## 2023-04-05 DIAGNOSIS — Z01812 Encounter for preprocedural laboratory examination: Secondary | ICD-10-CM

## 2023-04-05 HISTORY — DX: Other specified postprocedural states: R11.2

## 2023-04-05 HISTORY — DX: Mixed hyperlipidemia: E78.2

## 2023-04-05 HISTORY — DX: Nausea with vomiting, unspecified: R11.2

## 2023-04-05 HISTORY — DX: Other specified postprocedural states: Z98.890

## 2023-04-05 LAB — COMPREHENSIVE METABOLIC PANEL
ALT: 25 U/L (ref 0–44)
AST: 27 U/L (ref 15–41)
Albumin: 4.6 g/dL (ref 3.5–5.0)
Alkaline Phosphatase: 86 U/L (ref 38–126)
Anion gap: 9 (ref 5–15)
BUN: 16 mg/dL (ref 8–23)
CO2: 26 mmol/L (ref 22–32)
Calcium: 9.3 mg/dL (ref 8.9–10.3)
Chloride: 105 mmol/L (ref 98–111)
Creatinine, Ser: 0.71 mg/dL (ref 0.44–1.00)
GFR, Estimated: >60 mL/min (ref >60)
Glucose, Bld: 94 mg/dL (ref 70–99)
Potassium: 4.1 mmol/L (ref 3.5–5.1)
Sodium: 140 mmol/L (ref 135–145)
Total Bilirubin: 0.9 mg/dL (ref 0.3–1.2)
Total Protein: 7.1 g/dL (ref 6.5–8.1)

## 2023-04-05 LAB — URINALYSIS, ROUTINE W REFLEX MICROSCOPIC
Bilirubin Urine: NEGATIVE
Glucose, UA: NEGATIVE mg/dL
Hgb urine dipstick: NEGATIVE
Ketones, ur: NEGATIVE mg/dL
Leukocytes,Ua: NEGATIVE
Nitrite: NEGATIVE
Protein, ur: NEGATIVE mg/dL
Specific Gravity, Urine: 1.011 (ref 1.005–1.030)
pH: 5 (ref 5.0–8.0)

## 2023-04-05 LAB — TYPE AND SCREEN
ABO/RH(D): A POS
Antibody Screen: NEGATIVE

## 2023-04-05 LAB — C-REACTIVE PROTEIN: CRP: 0.7 mg/dL (ref <1.0)

## 2023-04-05 LAB — CBC
HCT: 40.6 % (ref 36.0–46.0)
Hemoglobin: 13.1 g/dL (ref 12.0–15.0)
MCH: 30.7 pg (ref 26.0–34.0)
MCHC: 32.3 g/dL (ref 30.0–36.0)
MCV: 95.1 fL (ref 80.0–100.0)
Platelets: 286 10*3/uL (ref 150–400)
RBC: 4.27 MIL/uL (ref 3.87–5.11)
RDW: 12.3 % (ref 11.5–15.5)
WBC: 4.3 10*3/uL (ref 4.0–10.5)
nRBC: 0 % (ref 0.0–0.2)

## 2023-04-05 LAB — SURGICAL PCR SCREEN
MRSA, PCR: NEGATIVE
Staphylococcus aureus: NEGATIVE

## 2023-04-05 LAB — SEDIMENTATION RATE: Sed Rate: 9 mm/hr (ref 0–22)

## 2023-04-05 NOTE — Patient Instructions (Addendum)
Your procedure is scheduled on: Wednesday, May 15 Report to the Registration Desk on the 1st floor of the [facility] To find out your arrival time, please call [phone] between 1PM - 3PM on: Tuesday, May 14 If your arrival time is 6:00 am, do not arrive before that time as the [facility] entrance doors do not open until 6:00 am.  REMEMBER: Instructions that are not followed completely may result in serious medical risk, up to and including death; or upon the discretion of your surgeon and anesthesiologist your surgery may need to be rescheduled.  Do not eat food after midnight the night before surgery.  No gum chewing or hard candies.  You may however, drink CLEAR liquids up to 2 hours before you are scheduled to arrive for your surgery. Do not drink anything within 2 hours of your scheduled arrival time.  Clear liquids include: - water  - apple juice without pulp - gatorade (not RED colors) - black coffee or tea (Do NOT add milk or creamers to the coffee or tea) Do NOT drink anything that is not on this list.  In addition, your doctor has ordered for you to drink the provided:  Ensure Pre-Surgery Clear Carbohydrate Drink  Drinking this carbohydrate drink up to two hours before surgery helps to reduce insulin resistance and improve patient outcomes. Please complete drinking 2 hours before scheduled arrival time.  One week prior to surgery: starting May 8 Stop meloxicam and Anti-inflammatories (NSAIDS) such as Advil, Aleve, Ibuprofen, Motrin, Naproxen, Naprosyn and Aspirin based products such as Excedrin, Goody's Powder, BC Powder. Stop ANY OVER THE COUNTER supplements until after surgery. You may however, continue to take Tylenol if needed for pain up until the day of surgery.  DO NOT TAKE ANY MEDICATIONS THE MORNING OF SURGERY  No Alcohol for 24 hours before or after surgery.  No Smoking including e-cigarettes for 24 hours before surgery.  No chewable tobacco products  for at least 6 hours before surgery.  No nicotine patches on the day of surgery.  Do not use any "recreational" drugs for at least a week (preferably 2 weeks) before your surgery.  Please be advised that the combination of cocaine and anesthesia may have negative outcomes, up to and including death. If you test positive for cocaine, your surgery will be cancelled.  On the morning of surgery brush your teeth with toothpaste and water, you may rinse your mouth with mouthwash if you wish. Do not swallow any toothpaste or mouthwash.  Use CHG Soap as directed on instruction sheet.  Do not wear jewelry, make-up, hairpins, clips or nail polish.  Do not wear lotions, powders, or perfumes.   Do not shave body hair from the neck down 48 hours before surgery.  Contact lenses, hearing aids and dentures may not be worn into surgery.  Do not bring valuables to the hospital. [facility] is not responsible for any missing/lost belongings or valuables.   Notify your doctor if there is any change in your medical condition (cold, fever, infection).  Wear comfortable clothing (specific to your surgery type) to the hospital.  After surgery, you can help prevent lung complications by doing breathing exercises.  Take deep breaths and cough every 1-2 hours. Your doctor may order a device called an Incentive Spirometer to help you take deep breaths.  If you are being admitted to the hospital overnight, leave your suitcase in the car. After surgery it may be brought to your room.  In case  of increased patient census, it may be necessary for you, the patient, to continue your postoperative care in the Same Day Surgery department.  If you are being discharged the day of surgery, you will not be allowed to drive home. You will need a responsible individual to drive you home and stay with you for 24 hours after surgery.   If you are taking public transportation, you will need to have a responsible individual  with you.  Please call the Pre-admissions Testing Dept. at [phone] if you have any questions about these instructions.  Surgery Visitation Policy:  Patients having surgery or a procedure may have two visitors.  Children under the age of 4 must have an adult with them who is not the patient.  Inpatient Visitation:    Visiting hours are 7 a.m. to 8 p.m. Up to four visitors are allowed at one time in a patient room. The visitors may rotate out with other people during the day.  One visitor age 33 or older may stay with the patient overnight and must be in the room by 8 p.m.  Preoperative Educational Videos for Total Hip, Knee and Shoulder Replacements  To better prepare for surgery, please view our videos that explain the physical activity and discharge planning required to have the best surgical recovery at [facility].  [email or web address]  Questions? Call [phone] or email [email or web address]

## 2023-04-06 DIAGNOSIS — M1611 Unilateral primary osteoarthritis, right hip: Secondary | ICD-10-CM | POA: Diagnosis not present

## 2023-04-09 LAB — IGE: IgE (Immunoglobulin E), Serum: 208 IU/mL (ref 6–495)

## 2023-04-09 NOTE — H&P (Signed)
ORTHOPAEDIC HISTORY & PHYSICAL Raenette Rover, [place] - 04/06/2023 9:45 AM EDT Formatting of this note is different from the original. NAME: Bridget Pollard H&P Date: 04/06/2023 Procedure Date: 04/12/2023  Chief Complaint: right hip pain  HPI Bridget Pollard is a 67 y.o. female who has severe knee pain and has failed conservative treatment including prednisone, muscle relaxers, NSAID's, PT, and activity modification. She states the pain is worse with weightbearing. She has been having difficulty ambulating long distances and has been affecting her work. She has been using a cane to help her ambulate. She has requested operative intervention for relief of her DJD symptoms. Patient does not have a cardiac history and is not a diabetic.  Medications & Allergies Allergies: Allergies Allergen Reactions Penicillins Hives  Home Medicines: Current Outpatient Medications on File Prior to Visit Medication Sig Dispense Refill meloxicam (MOBIC) 15 MG tablet Take 1 tablet by mouth once daily methocarbamoL (ROBAXIN) 750 MG tablet Take 750 mg by mouth 2 (two) times daily  No current facility-administered medications on file prior to visit.  Medical / Surgical History  Past Medical History: Diagnosis Date History of chicken pox   Past Surgical History: Procedure Laterality Date SHOULDER SURGERY Left WISDOM TEETH   Physical Exam  Ht:160 cm (5\' 3" ) Wt:70.9 kg (156 lb 6.4 oz) BMI: Body mass index is 27.71 kg/m.  General/Constitutional: No apparent distress: well-nourished and well developed. Eyes: Pupils equal, round with synchronous movement. Lymphatic: No palpable adenopathy. Respiratory: Patient has appropriate chest rise and fall with inspiration and expiration. She does not appear short of breath. Auscultation of her lungs reveals no crackles, wheezes, Rales or rhonchi. Cardiovascular: Patient has a regular rate and rhythm appreciated on auscultation. There does not appear to be any  murmurs, rubs, gallops or heaves appreciated. Patient is neurovascularly intact on her right lower extremity. Tibial pulses appreciated (2+). Integumentary: No impressive skin lesions present, except as noted in detailed exam. Neuro/Psych: Normal mood and affect, oriented to person, place and time. Musculoskeletal:  Right hip exam Upon inspection of the patient's right hip there does not appear to be any swelling, gross deformities or changes to the skin. Patient denies having any pain to palpation over the affected joint. With range of motion testing patient is able to passively flex to about 100 degrees before she reports pain. Patient has limited range of motion with internal and external rotation reports pain with both motions.  Imaging None ordered today  Data No results found for: "WBC", "HGB", "HCT", "PLT" No results found for: "NA", "K", "CL", "CO2", "BUN", "CREATININE", "GLUCOSE" No results found for: "APTT", "INR" No results found for: "COLORU", "CLARITYU", "SPECGRAV", "PHUR", "PROTEINUR", "GLUCOSEU", "KETONESU", "BLOODU", "NITRITE", "LEUKOCYTESUR", "BILIRUBINUR", "UROBILINOGEN", "RBCUA", "WBCUA", "SQUAMEPI", "CASTUA", "BACTERIA", "UACOMMENT"  Assesment and Plan Right Hip DJD  I have recommended that Bridget Pollard undergo right total hip replacement . Consents has been signed. The risks, benefits, prognosis and alternatives including but not limited to DVT, PE, infection, neurovascular injury, failure of the procedure and death were explained to the patient and she is willing to proceed with surgery as described to her by myself. Plan will be for post operative admission of at least 1 midnight for pain control and PT. She will be managed with DVT prophylaxis, antibiotics peri-operatively for 24 hours and aggressive in patient rehab.  Today discussed discontinuing taking meloxicam now to avoid any surgical complications. The patient is not currently taking any other  medications.  Patient does not have any known  cardiac history. She is not a diabetic.  Patient will need to have reimaging of her hip before surgery, call has been placed to have the patient return to clinic for reimaging.  Patient is cleared to have surgery with Dr. Ernest Pine on 04/12/2023  Raenette Rover, PA [facility] 04/06/2023 Electronically signed by Raenette Rover, PA at 04/06/2023 5:13 PM EDT

## 2023-04-12 ENCOUNTER — Ambulatory Visit: Payer: BC Managed Care – PPO | Admitting: Certified Registered"

## 2023-04-12 ENCOUNTER — Observation Stay
Admission: RE | Admit: 2023-04-12 | Discharge: 2023-04-13 | Disposition: A | Discharge status: Home / Self Care | Payer: BC Managed Care – PPO | Attending: Orthopedic Surgery | Admitting: Orthopedic Surgery

## 2023-04-12 ENCOUNTER — Ambulatory Visit: Payer: BC Managed Care – PPO | Admitting: Urgent Care

## 2023-04-12 ENCOUNTER — Encounter
Admission: RE | Disposition: A | Discharge status: Home / Self Care | Payer: Self-pay | Source: Home / Self Care | Attending: Orthopedic Surgery

## 2023-04-12 ENCOUNTER — Other Ambulatory Visit: Payer: Self-pay

## 2023-04-12 ENCOUNTER — Encounter: Payer: Self-pay | Admitting: Orthopedic Surgery

## 2023-04-12 ENCOUNTER — Observation Stay: Payer: BC Managed Care – PPO

## 2023-04-12 DIAGNOSIS — M1611 Unilateral primary osteoarthritis, right hip: Principal | ICD-10-CM

## 2023-04-12 DIAGNOSIS — Z96641 Presence of right artificial hip joint: Secondary | ICD-10-CM

## 2023-04-12 DIAGNOSIS — Z880 Allergy status to penicillin: Secondary | ICD-10-CM

## 2023-04-12 DIAGNOSIS — Z471 Aftercare following joint replacement surgery: Secondary | ICD-10-CM | POA: Diagnosis not present

## 2023-04-12 DIAGNOSIS — Z01812 Encounter for preprocedural laboratory examination: Secondary | ICD-10-CM

## 2023-04-12 HISTORY — PX: TOTAL HIP ARTHROPLASTY: SHX124

## 2023-04-12 LAB — ABO/RH: ABO/RH(D): A POS

## 2023-04-12 SURGERY — ARTHROPLASTY, HIP, TOTAL,POSTERIOR APPROACH
Anesthesia: Spinal | Site: Hip | Laterality: Right

## 2023-04-12 MED ORDER — HYDROMORPHONE HCL 1 MG/ML IJ SOLN: 0.5000 mg | INTRAMUSCULAR | Status: DC | PRN

## 2023-04-12 MED ORDER — PROPOFOL 500 MG/50ML IV EMUL
INTRAVENOUS | Status: DC | PRN
  Administered 2023-04-12: 75 ug/kg/min via INTRAVENOUS

## 2023-04-12 MED ORDER — CEFAZOLIN SODIUM-DEXTROSE 2-4 GM/100ML-% IV SOLN
2.0000 g | Freq: Three times a day (TID) | INTRAVENOUS | Status: AC
  Administered 2023-04-12 (×2): 2 g via INTRAVENOUS
  Filled 2023-04-12 (×2): qty 100

## 2023-04-12 MED ORDER — CELECOXIB 200 MG PO CAPS
200.0000 mg | ORAL_CAPSULE | Freq: Two times a day (BID) | ORAL | Status: DC
  Administered 2023-04-12 – 2023-04-13 (×2): 200 mg via ORAL
  Filled 2023-04-12 (×2): qty 1

## 2023-04-12 MED ORDER — ACETAMINOPHEN 10 MG/ML IV SOLN
1000.0000 mg | Freq: Four times a day (QID) | INTRAVENOUS | Status: AC
  Administered 2023-04-12 – 2023-04-13 (×3): 1000 mg via INTRAVENOUS
  Filled 2023-04-12 (×2): qty 100

## 2023-04-12 MED ORDER — TRANEXAMIC ACID-NACL 1000-0.7 MG/100ML-% IV SOLN
1000.0000 mg | INTRAVENOUS | Status: AC
  Administered 2023-04-12: 1000 mg via INTRAVENOUS

## 2023-04-12 MED ORDER — CHLORHEXIDINE GLUCONATE 0.12 % MT SOLN
15.0000 mL | Freq: Once | OROMUCOSAL | Status: AC
  Administered 2023-04-12: 15 mL via OROMUCOSAL

## 2023-04-12 MED ORDER — PHENYLEPHRINE 80 MCG/ML (10ML) SYRINGE FOR IV PUSH (FOR BLOOD PRESSURE SUPPORT)
PREFILLED_SYRINGE | INTRAVENOUS | Status: AC
  Filled 2023-04-12: qty 10

## 2023-04-12 MED ORDER — PHENOL 1.4 % MT LIQD: 1.0000 | OROMUCOSAL | Status: DC | PRN

## 2023-04-12 MED ORDER — FENTANYL CITRATE (PF) 100 MCG/2ML IJ SOLN
INTRAMUSCULAR | Status: AC
  Filled 2023-04-12: qty 2

## 2023-04-12 MED ORDER — CEFAZOLIN SODIUM-DEXTROSE 2-4 GM/100ML-% IV SOLN
2.0000 g | INTRAVENOUS | Status: AC
  Administered 2023-04-12: 2 g via INTRAVENOUS

## 2023-04-12 MED ORDER — ONDANSETRON HCL 4 MG/2ML IJ SOLN
INTRAMUSCULAR | Status: DC | PRN
  Administered 2023-04-12: 4 mg via INTRAVENOUS

## 2023-04-12 MED ORDER — ASPIRIN 81 MG PO TBEC
81.0000 mg | DELAYED_RELEASE_TABLET | Freq: Two times a day (BID) | ORAL | Status: DC
  Administered 2023-04-13: 81 mg via ORAL
  Filled 2023-04-12: qty 1

## 2023-04-12 MED ORDER — TRAMADOL HCL 50 MG PO TABS
ORAL_TABLET | ORAL | Status: AC
  Filled 2023-04-12: qty 1

## 2023-04-12 MED ORDER — OXYCODONE HCL 5 MG PO TABS: 10.0000 mg | ORAL_TABLET | ORAL | Status: DC | PRN

## 2023-04-12 MED ORDER — ALUM & MAG HYDROXIDE-SIMETH 200-200-20 MG/5ML PO SUSP: 30.0000 mL | ORAL | Status: DC | PRN

## 2023-04-12 MED ORDER — FENTANYL CITRATE (PF) 100 MCG/2ML IJ SOLN
25.0000 ug | INTRAMUSCULAR | Status: DC | PRN
  Administered 2023-04-12 (×2): 25 ug via INTRAVENOUS

## 2023-04-12 MED ORDER — FLEET ENEMA 7-19 GM/118ML RE ENEM: 1.0000 | ENEMA | Freq: Once | RECTAL | Status: DC | PRN

## 2023-04-12 MED ORDER — ONDANSETRON HCL 4 MG/2ML IJ SOLN
4.0000 mg | Freq: Four times a day (QID) | INTRAMUSCULAR | Status: DC | PRN
  Administered 2023-04-12: 4 mg via INTRAVENOUS
  Filled 2023-04-12: qty 2

## 2023-04-12 MED ORDER — PROPOFOL 1000 MG/100ML IV EMUL
INTRAVENOUS | Status: AC
  Filled 2023-04-12: qty 100

## 2023-04-12 MED ORDER — ACETAMINOPHEN 325 MG PO TABS: 325.0000 mg | ORAL_TABLET | Freq: Four times a day (QID) | ORAL | Status: DC | PRN

## 2023-04-12 MED ORDER — DEXAMETHASONE SODIUM PHOSPHATE 10 MG/ML IJ SOLN
8.0000 mg | Freq: Once | INTRAMUSCULAR | Status: AC
  Administered 2023-04-12: 8 mg via INTRAVENOUS

## 2023-04-12 MED ORDER — FAMOTIDINE 20 MG PO TABS
ORAL_TABLET | ORAL | Status: AC
  Filled 2023-04-12: qty 1

## 2023-04-12 MED ORDER — TRANEXAMIC ACID-NACL 1000-0.7 MG/100ML-% IV SOLN
1000.0000 mg | Freq: Once | INTRAVENOUS | Status: AC
  Administered 2023-04-12: 1000 mg via INTRAVENOUS

## 2023-04-12 MED ORDER — BUPIVACAINE HCL (PF) 0.5 % IJ SOLN
INTRAMUSCULAR | Status: DC | PRN
  Administered 2023-04-12: 3 mL via INTRATHECAL

## 2023-04-12 MED ORDER — CELECOXIB 200 MG PO CAPS
400.0000 mg | ORAL_CAPSULE | Freq: Once | ORAL | Status: AC
  Administered 2023-04-12: 400 mg via ORAL

## 2023-04-12 MED ORDER — ACETAMINOPHEN 10 MG/ML IV SOLN
INTRAVENOUS | Status: AC
  Filled 2023-04-12: qty 100

## 2023-04-12 MED ORDER — 0.9 % SODIUM CHLORIDE (POUR BTL) OPTIME
TOPICAL | Status: DC | PRN
  Administered 2023-04-12: 500 mL

## 2023-04-12 MED ORDER — MIDAZOLAM HCL 5 MG/5ML IJ SOLN
INTRAMUSCULAR | Status: DC | PRN
  Administered 2023-04-12: 2 mg via INTRAVENOUS

## 2023-04-12 MED ORDER — CEFAZOLIN SODIUM-DEXTROSE 2-4 GM/100ML-% IV SOLN
INTRAVENOUS | Status: AC
  Filled 2023-04-12: qty 100

## 2023-04-12 MED ORDER — GABAPENTIN 300 MG PO CAPS
ORAL_CAPSULE | ORAL | Status: AC
  Filled 2023-04-12: qty 1

## 2023-04-12 MED ORDER — LACTATED RINGERS IV SOLN: INTRAVENOUS | Status: DC

## 2023-04-12 MED ORDER — TRANEXAMIC ACID-NACL 1000-0.7 MG/100ML-% IV SOLN
INTRAVENOUS | Status: AC
  Filled 2023-04-12: qty 100

## 2023-04-12 MED ORDER — ONDANSETRON HCL 4 MG/2ML IJ SOLN
INTRAMUSCULAR | Status: AC
  Filled 2023-04-12: qty 2

## 2023-04-12 MED ORDER — DIPHENHYDRAMINE HCL 12.5 MG/5ML PO ELIX: 12.5000 mg | ORAL_SOLUTION | ORAL | Status: DC | PRN

## 2023-04-12 MED ORDER — BISACODYL 10 MG RE SUPP: 10.0000 mg | Freq: Every day | RECTAL | Status: DC | PRN

## 2023-04-12 MED ORDER — PHENYLEPHRINE HCL-NACL 20-0.9 MG/250ML-% IV SOLN
INTRAVENOUS | Status: DC | PRN
  Administered 2023-04-12: 25 ug/min via INTRAVENOUS

## 2023-04-12 MED ORDER — PHENYLEPHRINE HCL (PRESSORS) 10 MG/ML IV SOLN
INTRAVENOUS | Status: DC | PRN
  Administered 2023-04-12 (×2): 80 ug via INTRAVENOUS

## 2023-04-12 MED ORDER — CHLORHEXIDINE GLUCONATE 0.12 % MT SOLN
OROMUCOSAL | Status: AC
  Filled 2023-04-12: qty 15

## 2023-04-12 MED ORDER — CELECOXIB 200 MG PO CAPS
ORAL_CAPSULE | ORAL | Status: AC
  Filled 2023-04-12: qty 2

## 2023-04-12 MED ORDER — BUPIVACAINE HCL (PF) 0.5 % IJ SOLN: INTRAMUSCULAR | Status: DC | PRN

## 2023-04-12 MED ORDER — TRAMADOL HCL 50 MG PO TABS
50.0000 mg | ORAL_TABLET | ORAL | Status: DC | PRN
  Administered 2023-04-12: 50 mg via ORAL

## 2023-04-12 MED ORDER — PHENYLEPHRINE HCL-NACL 20-0.9 MG/250ML-% IV SOLN
INTRAVENOUS | Status: AC
  Filled 2023-04-12: qty 250

## 2023-04-12 MED ORDER — SODIUM CHLORIDE 0.9 % IR SOLN
Status: DC | PRN
  Administered 2023-04-12: 3000 mL

## 2023-04-12 MED ORDER — METOCLOPRAMIDE HCL 5 MG PO TABS
10.0000 mg | ORAL_TABLET | Freq: Three times a day (TID) | ORAL | Status: DC
  Administered 2023-04-12 – 2023-04-13 (×2): 10 mg via ORAL
  Filled 2023-04-12 (×4): qty 2

## 2023-04-12 MED ORDER — OXYCODONE HCL 5 MG PO TABS
5.0000 mg | ORAL_TABLET | ORAL | Status: DC | PRN
  Administered 2023-04-12 (×2): 5 mg via ORAL
  Filled 2023-04-12 (×2): qty 1

## 2023-04-12 MED ORDER — MENTHOL 3 MG MT LOZG: 1.0000 | LOZENGE | OROMUCOSAL | Status: DC | PRN

## 2023-04-12 MED ORDER — PROPOFOL 10 MG/ML IV BOLUS
INTRAVENOUS | Status: AC
  Filled 2023-04-12: qty 20

## 2023-04-12 MED ORDER — ORAL CARE MOUTH RINSE: 15.0000 mL | Freq: Once | OROMUCOSAL | Status: AC

## 2023-04-12 MED ORDER — PANTOPRAZOLE SODIUM 40 MG PO TBEC
40.0000 mg | DELAYED_RELEASE_TABLET | Freq: Two times a day (BID) | ORAL | Status: DC
  Administered 2023-04-12 – 2023-04-13 (×3): 40 mg via ORAL
  Filled 2023-04-12 (×3): qty 1

## 2023-04-12 MED ORDER — ENSURE PRE-SURGERY PO LIQD
296.0000 mL | Freq: Once | ORAL | Status: DC
  Filled 2023-04-12: qty 296

## 2023-04-12 MED ORDER — MIDAZOLAM HCL 2 MG/2ML IJ SOLN
INTRAMUSCULAR | Status: AC
  Filled 2023-04-12: qty 2

## 2023-04-12 MED ORDER — DEXAMETHASONE SODIUM PHOSPHATE 10 MG/ML IJ SOLN
INTRAMUSCULAR | Status: AC
  Filled 2023-04-12: qty 1

## 2023-04-12 MED ORDER — GABAPENTIN 300 MG PO CAPS
300.0000 mg | ORAL_CAPSULE | Freq: Once | ORAL | Status: AC
  Administered 2023-04-12: 300 mg via ORAL

## 2023-04-12 MED ORDER — FAMOTIDINE 20 MG PO TABS
20.0000 mg | ORAL_TABLET | Freq: Once | ORAL | Status: AC
  Administered 2023-04-12: 20 mg via ORAL

## 2023-04-12 MED ORDER — ONDANSETRON HCL 4 MG PO TABS: 4.0000 mg | ORAL_TABLET | Freq: Four times a day (QID) | ORAL | Status: DC | PRN

## 2023-04-12 MED ORDER — SENNOSIDES-DOCUSATE SODIUM 8.6-50 MG PO TABS
1.0000 | ORAL_TABLET | Freq: Two times a day (BID) | ORAL | Status: DC
  Administered 2023-04-12 – 2023-04-13 (×3): 1 via ORAL
  Filled 2023-04-12 (×3): qty 1

## 2023-04-12 MED ORDER — ENOXAPARIN SODIUM 40 MG/0.4ML IJ SOSY: 40.0000 mg | PREFILLED_SYRINGE | INTRAMUSCULAR | Status: DC

## 2023-04-12 MED ORDER — SURGIPHOR WOUND IRRIGATION SYSTEM - OPTIME: TOPICAL | Status: DC | PRN

## 2023-04-12 MED ORDER — SODIUM CHLORIDE 0.9 % IV SOLN: INTRAVENOUS | Status: DC

## 2023-04-12 MED ORDER — MAGNESIUM HYDROXIDE 400 MG/5ML PO SUSP
30.0000 mL | Freq: Every day | ORAL | Status: DC
  Administered 2023-04-13: 30 mL via ORAL
  Filled 2023-04-12: qty 30

## 2023-04-12 SURGICAL SUPPLY — 54 items
BLADE SAW 90X25X1.19 OSCILLAT (BLADE) ×1 IMPLANT
DRAPE 3/4 80X56 (DRAPES) ×1 IMPLANT
DRAPE INCISE IOBAN 66X60 STRL (DRAPES) ×1 IMPLANT
DRSG AQUACEL AG ADV 3.5X14 (GAUZE/BANDAGES/DRESSINGS) ×1 IMPLANT
DRSG DERMACEA 8X12 NADH (GAUZE/BANDAGES/DRESSINGS) IMPLANT
DRSG MEPILEX SACRM 8.7X9.8 (GAUZE/BANDAGES/DRESSINGS) ×1 IMPLANT
DRSG TEGADERM 4X4.75 (GAUZE/BANDAGES/DRESSINGS) ×1 IMPLANT
DRSG TELFA 3X4 N-ADH STERILE (GAUZE/BANDAGES/DRESSINGS) ×1 IMPLANT
DURAPREP 26ML APPLICATOR (WOUND CARE) ×2 IMPLANT
ELECT CAUTERY BLADE 6.4 (BLADE) ×1 IMPLANT
ELECT REM PT RETURN 9FT ADLT (ELECTROSURGICAL) ×1
ELECTRODE REM PT RTRN 9FT ADLT (ELECTROSURGICAL) ×1 IMPLANT
GLOVE BIOGEL M STRL SZ7.5 (GLOVE) ×2 IMPLANT
GLOVE SRG 8 PF TXTR STRL LF DI (GLOVE) ×2 IMPLANT
GLOVE SURG SYN 8.0 (GLOVE) ×2 IMPLANT
GLOVE SURG SYN 8.0 PF PI (GLOVE) ×2 IMPLANT
GLOVE SURG UNDER POLY LF SZ8 (GLOVE) ×2
GOWN STRL REUS W/ TWL LRG LVL3 (GOWN DISPOSABLE) ×2 IMPLANT
GOWN STRL REUS W/ TWL XL LVL3 (GOWN DISPOSABLE) ×1 IMPLANT
GOWN STRL REUS W/TWL LRG LVL3 (GOWN DISPOSABLE) ×2
GOWN STRL REUS W/TWL XL LVL3 (GOWN DISPOSABLE) ×1
GOWN TOGA ZIPPER T7+ PEEL AWAY (MISCELLANEOUS) ×1 IMPLANT
HANDLE YANKAUER SUCT OPEN TIP (MISCELLANEOUS) ×1 IMPLANT
HEAD FEM STD 32X+1 STRL (Hips) IMPLANT
HEMOVAC 400CC 10FR (MISCELLANEOUS) ×1 IMPLANT
HOLDER FOLEY CATH W/STRAP (MISCELLANEOUS) ×1 IMPLANT
HOOD PEEL AWAY T7 (MISCELLANEOUS) ×1 IMPLANT
IV NS IRRIG 3000ML ARTHROMATIC (IV SOLUTION) ×1 IMPLANT
KIT PEG BOARD PINK (KITS) ×1 IMPLANT
KIT TURNOVER KIT A (KITS) ×1 IMPLANT
LINER ACET PNNCL PLUS4 NEUTRAL (Hips) IMPLANT
MANIFOLD NEPTUNE II (INSTRUMENTS) ×2 IMPLANT
NS IRRIG 500ML POUR BTL (IV SOLUTION) ×1 IMPLANT
PACK HIP PROSTHESIS (MISCELLANEOUS) ×1 IMPLANT
PIN SECT CUP 50MM (Hips) IMPLANT
PINNACLE PLUS 4 NEUTRAL (Hips) ×1 IMPLANT
PULSAVAC PLUS IRRIG FAN TIP (DISPOSABLE) ×1
SOL PREP PVP 2OZ (MISCELLANEOUS) ×1
SOLUTION IRRIG SURGIPHOR (IV SOLUTION) ×1 IMPLANT
SOLUTION PREP PVP 2OZ (MISCELLANEOUS) ×1 IMPLANT
SPONGE DRAIN TRACH 4X4 STRL 2S (GAUZE/BANDAGES/DRESSINGS) ×1 IMPLANT
STAPLER SKIN PROX 35W (STAPLE) ×1 IMPLANT
STEM FEM ACTIS HIGH SZ3 (Stem) IMPLANT
SUT ETHIBOND #5 BRAIDED 30INL (SUTURE) ×1 IMPLANT
SUT VIC AB 0 CT1 36 (SUTURE) ×1 IMPLANT
SUT VIC AB 1 CT1 36 (SUTURE) ×2 IMPLANT
SUT VIC AB 2-0 CT1 27 (SUTURE) ×1
SUT VIC AB 2-0 CT1 TAPERPNT 27 (SUTURE) ×1 IMPLANT
TAPE CLOTH 3X10 WHT NS LF (GAUZE/BANDAGES/DRESSINGS) ×1 IMPLANT
TIP FAN IRRIG PULSAVAC PLUS (DISPOSABLE) ×1 IMPLANT
TOWEL OR 17X26 4PK STRL BLUE (TOWEL DISPOSABLE) IMPLANT
TRAP FLUID SMOKE EVACUATOR (MISCELLANEOUS) ×1 IMPLANT
TRAY FOLEY MTR SLVR 16FR STAT (SET/KITS/TRAYS/PACK) ×1 IMPLANT
WATER STERILE IRR 1000ML POUR (IV SOLUTION) ×1 IMPLANT

## 2023-04-12 NOTE — Interval H&P Note (Signed)
History and Physical Interval Note:  04/12/2023 6:23 AM  Bridget Pollard  has presented today for surgery, with the diagnosis of PRIMARY OSTEOARTHRITIS OF RIGHT HIP.  The various methods of treatment have been discussed with the patient and family. After consideration of risks, benefits and other options for treatment, the patient has consented to  Procedure(s): TOTAL HIP ARTHROPLASTY (Right) as a surgical intervention.  The patient's history has been reviewed, patient examined, no change in status, stable for surgery.  I have reviewed the patient's chart and labs.  Questions were answered to the patient's satisfaction.     Nardos Putnam P Latisia Hilaire

## 2023-04-12 NOTE — Plan of Care (Signed)
Patient A&Ox4, from home, independent in room  

## 2023-04-12 NOTE — Plan of Care (Signed)
  Problem: Education: Goal: Knowledge of the prescribed therapeutic regimen will improve Outcome: Progressing Goal: Understanding of discharge needs will improve Outcome: Progressing Goal: Individualized Educational Video(s) Outcome: Progressing   Problem: Activity: Goal: Ability to avoid complications of mobility impairment will improve Outcome: Progressing Goal: Ability to tolerate increased activity will improve Outcome: Progressing   Problem: Clinical Measurements: Goal: Postoperative complications will be avoided or minimized Outcome: Progressing   Problem: Pain Management: Goal: Pain level will decrease with appropriate interventions Outcome: Progressing   Problem: Skin Integrity: Goal: Will show signs of wound healing Outcome: Progressing   Problem: Education: Goal: Knowledge of General Education information will improve Description: Including pain rating scale, medication(s)/side effects and non-pharmacologic comfort measures Outcome: Progressing   Problem: Clinical Measurements: Goal: Ability to maintain clinical measurements within normal limits will improve Outcome: Progressing Goal: Will remain free from infection Outcome: Progressing Goal: Diagnostic test results will improve Outcome: Progressing Goal: Respiratory complications will improve Outcome: Progressing Goal: Cardiovascular complication will be avoided Outcome: Progressing   Problem: Skin Integrity: Goal: Risk for impaired skin integrity will decrease Outcome: Progressing

## 2023-04-12 NOTE — Transfer of Care (Signed)
Immediate Anesthesia Transfer of Care Note  Patient: Bridget Pollard  Procedure(s) Performed: TOTAL HIP ARTHROPLASTY (Right: Hip)  Patient Location: PACU  Anesthesia Type:Spinal  Level of Consciousness: drowsy  Airway & Oxygen Therapy: Patient Spontanous Breathing and Patient connected to face mask oxygen  Post-op Assessment: Report given to RN and Post -op Vital signs reviewed and stable  Post vital signs: Reviewed and stable  Last Vitals:  Vitals Value Taken Time  BP 93/65 04/12/23 1028  Temp    Pulse 64 04/12/23 1030  Resp 14 04/12/23 1030  SpO2 99 % 04/12/23 1030  Vitals shown include unvalidated device data.  Last Pain:  Vitals:   04/12/23 0636  TempSrc: Oral  PainSc: 7          Complications: No notable events documented.

## 2023-04-12 NOTE — Evaluation (Signed)
Physical Therapy Evaluation Patient Details Name: Bridget Pollard MRN: [id number] DOB: 09-26-1956 Today's Date: 04/12/2023  History of Present Illness  Pt is a 67 y.o. female s/p right posterior THA.  Clinical Impression  Pt admitted with above diagnosis. Pt currently with functional limitations due to the deficits listed below (see PT Problem List). Pt received upright in bed agreeable to PT. Reports at baseline she has been mod-I with SPC due to R hip pain but independent with ADL's/IADL's.   Prior to mobility pt and spouse educated on WB status and post hip precautions. Pt performing bed mobility and STS at RW at mod-I level with brief VC's for safe hand placement to stand at [facility]. Pt with excellent RLE stability in standing completing 40' of gait at supervision level and reciprocal gait with only 2/10 pain reported. Pt returns to recliner with all needs in reach following all post hip precautions throughout mobility. Anticipate pending gait progression and stairs training pt will be appropriate for home d/c with f/u recs to address acute deficits.    Recommendations for follow up therapy are one component of a multi-disciplinary discharge planning process, led by the attending physician.  Recommendations may be updated based on patient status, additional functional criteria and insurance authorization.     Assistance Recommended at Discharge Set up Supervision/Assistance  Patient can return home with the following  A little help with walking and/or transfers;A little help with bathing/dressing/bathroom;Assistance with cooking/housework;Assist for transportation;Help with stairs or ramp for entrance    Equipment Recommendations BSC/3in1  Recommendations for Other Services       Functional Status Assessment Patient has had a recent decline in their functional status and demonstrates the ability to make significant improvements in function in a reasonable and predictable amount of time.      Precautions / Restrictions Precautions Precautions: Posterior Hip Precaution Booklet Issued: No Restrictions Weight Bearing Restrictions: Yes RLE Weight Bearing: Weight bearing as tolerated      Mobility  Bed Mobility Overal bed mobility: Modified Independent               Patient Response: Cooperative  Transfers Overall transfer level: Modified independent Equipment used: Rolling walker (2 wheels) Transfers: Sit to/from Stand Sit to Stand: Modified independent (Device/Increase time)                Ambulation/Gait Ambulation/Gait assistance: Supervision Gait Distance (Feet): 40 Feet Assistive device: Rolling walker (2 wheels) Gait Pattern/deviations: Step-through pattern, Decreased step length - right, Decreased step length - left, Decreased stance time - right       General Gait Details: pt completing reciprocal gait with little use of RW to door and back in room.  Stairs            Wheelchair Mobility    Modified Rankin (Stroke Patients Only)       Balance Overall balance assessment: Mild deficits observed, not formally tested                                           Pertinent Vitals/Pain Pain Assessment Pain Assessment: 0-10 Pain Score: 2  Pain Descriptors / Indicators: Aching, Discomfort Pain Intervention(s): Limited activity within patient's tolerance, Monitored during session, Repositioned    Home Living Family/patient expects to be discharged to:: Private residence Living Arrangements: Spouse/significant other Available Help at Discharge: Family;Available 24 hours/day Type of Home: House Home Access:  Stairs to enter Entrance Stairs-Rails: Doctor, general practice of Steps: 3   Home Layout: One level Home Equipment: Agricultural consultant (2 wheels);Cane - single point;Other (comment) Lexicographer)      Prior Function Prior Level of Function : Independent/Modified Independent             Mobility  Comments: having to use SPC due to R hip OA pain       Hand Dominance        Extremity/Trunk Assessment   Upper Extremity Assessment Upper Extremity Assessment: Overall WFL for tasks assessed    Lower Extremity Assessment Lower Extremity Assessment: Generalized weakness;RLE deficits/detail RLE Deficits / Details: R posterior THA RLE Coordination: WNL       Communication   Communication: No difficulties  Cognition Arousal/Alertness: Awake/alert Behavior During Therapy: WFL for tasks assessed/performed Overall Cognitive Status: Within Functional Limits for tasks assessed                                          General Comments      Exercises Total Joint Exercises Ankle Circles/Pumps: AROM, Strengthening, Both, 10 reps, Supine Quad Sets: AROM, Strengthening, Right, 10 reps, Seated Gluteal Sets: AROM, Strengthening, Both, 10 reps, Seated Hip ABduction/ADduction: AROM, Strengthening, Right, 10 reps, Seated Other Exercises Other Exercises: Role of PT in acute setting, initiated HEP, reviewed post hip precautions and safe use of RW   Assessment/Plan    PT Assessment Patient needs continued PT services  PT Problem List Decreased strength;Decreased range of motion;Pain;Decreased knowledge of precautions       PT Treatment Interventions DME instruction;Balance training;Gait training;Neuromuscular re-education;Stair training;Functional mobility training;Patient/family education;Therapeutic activities;Therapeutic exercise    PT Goals (Current goals can be found in the Care Plan section)  Acute Rehab PT Goals Patient Stated Goal: to go home PT Goal Formulation: With patient Time For Goal Achievement: 04/26/23 Potential to Achieve Goals: Good    Frequency BID     Co-evaluation               AM-PAC PT "6 Clicks" Mobility  Outcome Measure Help needed turning from your back to your side while in a flat bed without using bedrails?: None Help  needed moving from lying on your back to sitting on the side of a flat bed without using bedrails?: None Help needed moving to and from a bed to a chair (including a wheelchair)?: A Little Help needed standing up from a chair using your arms (e.g., wheelchair or bedside chair)?: A Little Help needed to walk in hospital room?: A Little Help needed climbing 3-5 steps with a railing? : A Little 6 Click Score: 20    End of Session Equipment Utilized During Treatment: Gait belt Activity Tolerance: Patient tolerated treatment well Patient left: in chair;with call bell/phone within reach;with chair alarm set Nurse Communication: Mobility status PT Visit Diagnosis: Muscle weakness (generalized) (M62.81);Pain Pain - Right/Left: Right Pain - part of body: Hip    Time: 2130-8657 PT Time Calculation (min) (ACUTE ONLY): 25 min   Charges:   PT Evaluation $PT Eval Low Complexity: 1 Low PT Treatments $Therapeutic Exercise: 8-22 mins        Ahniyah Giancola M. Fairly IV, PT, DPT Physical Therapist- [place]  [facility]  04/12/2023, 3:50 PM

## 2023-04-12 NOTE — Op Note (Signed)
OPERATIVE NOTE  DATE OF SURGERY:  04/12/2023  PATIENT NAME:  Bridget Pollard   DOB: June 24, 1956  MRN: [id number]  PRE-OPERATIVE DIAGNOSIS: Degenerative arthrosis of the right hip, primary  POST-OPERATIVE DIAGNOSIS:  Same  PROCEDURE:  Right total hip arthroplasty  SURGEON:  Jena Gauss. M.D.  ASSISTANT:  Gean Birchwood, PA-C (present and scrubbed throughout the case, critical for assistance with exposure, retraction, instrumentation, and closure)  ANESTHESIA: spinal  ESTIMATED BLOOD LOSS: 100 mL  FLUIDS REPLACED: 1200 mL of crystalloid  DRAINS: 2 medium Hemovac drains  IMPLANTS UTILIZED: DePuy size 3 high offset Actis femoral stem, 50 mm OD Pinnacle 100 acetabular component, +4 mm neutral Pinnacle Altrx polyethylene insert, and a 32 mm CoCr +1 mm hip ball  INDICATIONS FOR SURGERY: Bridget Pollard is a 67 y.o. year old female with a long history of progressive hip and groin  pain. X-rays demonstrated severe degenerative changes. The patient had not seen any significant improvement despite conservative nonsurgical intervention. After discussion of the risks and benefits of surgical intervention, the patient expressed understanding of the risks benefits and agree with plans for total hip arthroplasty.   The risks, benefits, and alternatives were discussed at length including but not limited to the risks of infection, bleeding, nerve injury, stiffness, blood clots, the need for revision surgery, limb length inequality, dislocation, cardiopulmonary complications, among others, and they were willing to proceed.  PROCEDURE IN DETAIL: The patient was brought into the operating room and, after adequate spinal anesthesia was achieved, the patient was placed in a left lateral decubitus position. Axillary roll was placed and all bony prominences were well-padded. The patient's right hip was cleaned and prepped with alcohol and DuraPrep and draped in the usual sterile fashion. A "timeout" was  performed as per usual protocol. A lateral curvilinear incision was made gently curving towards the posterior superior iliac spine. The IT band was incised in line with the skin incision and the fibers of the gluteus maximus were split in line. The piriformis tendon was identified, skeletonized, and incised at its insertion to the proximal femur and reflected posteriorly. A T type posterior capsulotomy was performed. Prior to dislocation of the femoral head, a threaded Steinmann pin was inserted through a separate stab incision into the pelvis superior to the acetabulum and bent in the form of a stylus so as to assess limb length and hip offset throughout the procedure. The femoral head was then dislocated posteriorly. Inspection of the femoral head demonstrated severe degenerative changes with full-thickness loss of articular cartilage. The femoral neck cut was performed using an oscillating saw. The anterior capsule was elevated off of the femoral neck using a periosteal elevator. Attention was then directed to the acetabulum. The remnant of the labrum was excised using electrocautery. Inspection of the acetabulum also demonstrated significant degenerative changes. The acetabulum was reamed in sequential fashion up to a 49 mm diameter. Good punctate bleeding bone was encountered. A 50 mm Pinnacle 100 acetabular component was positioned and impacted into place. Good scratch fit was appreciated. A +4 mm neutral polyethylene trial was inserted.  Attention was then directed to the proximal femur.  Femoral broaches were inserted in a sequential fashion up to a size 3 broach. Calcar region was planed and a trial reduction was performed using a standard offset neck and a 32 mm hip ball with a +1 mm neck length.  There was decreased hip offset so was elected to trial with a high offset neck segment.  Good equalization of limb lengths and hip offset was appreciated and excellent stability was noted both anteriorly and  posteriorly. Trial components were removed. The acetabular shell was irrigated with copious amounts of normal saline with antibiotic solution and suctioned dry. A +4 mm neutral Pinnacle Altrx polyethylene insert was positioned and impacted into place. Next, a size 3 high offset Actis femoral stem was positioned and impacted into place. Excellent scratch fit was appreciated. A trial reduction was again performed with a 32 mm hip ball with a +1 mm neck length. Again, good equalization of limb lengths was appreciated and excellent stability appreciated both anteriorly and posteriorly. The hip was then dislocated and the trial hip ball was removed. The Morse taper was cleaned and dried. A 32 mm cobalt chrome hip ball with a +1 mm neck length was placed on the trunnion and impacted into place. The hip was then reduced and placed through range of motion. Excellent stability was appreciated both anteriorly and posteriorly.  The wound was irrigated with copious amounts of normal saline followed by 450 ml of Surgiphor and suctioned dry. Good hemostasis was appreciated. The posterior capsulotomy was repaired using #5 Ethibond. Piriformis tendon was reapproximated to the undersurface of the gluteus medius tendon using #5 Ethibond. The IT band was reapproximated using interrupted sutures of #1 Vicryl. Subcutaneous tissue was approximated using first #0 Vicryl followed by #2-0 Vicryl. The skin was closed with skin staples.  The patient tolerated the procedure well and was transported to the recovery room in stable condition.   Jena Gauss., M.D.

## 2023-04-12 NOTE — Anesthesia Procedure Notes (Signed)
Procedure Name: MAC Date/Time: 04/12/2023 7:28 AM  Performed by: Nelle Don, CRNAPre-anesthesia Checklist: Patient identified, Emergency Drugs available, Suction available and Patient being monitored Oxygen Delivery Method: Simple face mask

## 2023-04-12 NOTE — Anesthesia Preprocedure Evaluation (Signed)
Anesthesia Evaluation  Patient identified by MRN, date of birth, ID band Patient awake    Reviewed: Allergy & Precautions, H&P , NPO status , Patient's Chart, lab work & pertinent test results, reviewed documented beta blocker date and time   History of Anesthesia Complications (+) PONV and history of anesthetic complications  Airway Mallampati: III  TM Distance: >3 FB Neck ROM: full    Dental  (+) Dental Advidsory Given, Implants, Missing Permanent bridge top front:   Pulmonary neg pulmonary ROS, former smoker   Pulmonary exam normal breath sounds clear to auscultation       Cardiovascular Exercise Tolerance: Good negative cardio ROS Normal cardiovascular exam Rhythm:regular Rate:Normal     Neuro/Psych negative neurological ROS  negative psych ROS   GI/Hepatic negative GI ROS, Neg liver ROS,,,  Endo/Other  negative endocrine ROS    Renal/GU negative Renal ROS  negative genitourinary   Musculoskeletal   Abdominal   Peds  Hematology negative hematology ROS (+)   Anesthesia Other Findings Past Medical History: 02/2023: Degenerative arthritis of hip     Comment:  right No date: Mixed hyperlipidemia No date: PONV (postoperative nausea and vomiting)   Reproductive/Obstetrics negative OB ROS                             Anesthesia Physical Anesthesia Plan  ASA: 2  Anesthesia Plan: Spinal   Post-op Pain Management:    Induction: Intravenous  PONV Risk Score and Plan: 3 and Propofol infusion and TIVA  Airway Management Planned: Natural Airway and Simple Face Mask  Additional Equipment:   Intra-op Plan:   Post-operative Plan:   Informed Consent: I have reviewed the patients History and Physical, chart, labs and discussed the procedure including the risks, benefits and alternatives for the proposed anesthesia with the patient or authorized representative who has indicated his/her  understanding and acceptance.     Dental Advisory Given  Plan Discussed with: Anesthesiologist, CRNA and Surgeon  Anesthesia Plan Comments:        Anesthesia Quick Evaluation

## 2023-04-12 NOTE — Anesthesia Procedure Notes (Signed)
Spinal  Patient location during procedure: OR Start time: 04/12/2023 7:24 AM End time: 04/12/2023 7:28 AM Reason for block: surgical anesthesia Staffing Performed: resident/CRNA  Resident/CRNA: Nelle Don, CRNA Performed by: Nelle Don, CRNA Authorized by: Lenard Simmer, MD   Preanesthetic Checklist Completed: patient identified, IV checked, risks and benefits discussed, surgical consent, monitors and equipment checked, pre-op evaluation and timeout performed Spinal Block Patient position: sitting Prep: ChloraPrep Patient monitoring: heart rate, continuous pulse ox and blood pressure Approach: midline Location: L3-4 Injection technique: single-shot Needle Needle type: Pencan  Needle gauge: 24 G Needle length: 10 cm Additional Notes Expiration date of kit checked. Clear CSF flow prior to injection. Pt tolerated procedure well.

## 2023-04-13 ENCOUNTER — Encounter: Payer: Self-pay | Admitting: Orthopedic Surgery

## 2023-04-13 DIAGNOSIS — M1611 Unilateral primary osteoarthritis, right hip: Secondary | ICD-10-CM | POA: Diagnosis not present

## 2023-04-13 LAB — SURGICAL PATHOLOGY

## 2023-04-13 MED ORDER — TRAMADOL HCL 50 MG PO TABS: 50.0000 mg | ORAL_TABLET | ORAL | 0 refills | Status: DC | PRN

## 2023-04-13 MED ORDER — ASPIRIN 81 MG PO TBEC: 81.0000 mg | DELAYED_RELEASE_TABLET | Freq: Two times a day (BID) | ORAL | 2 refills | Status: DC

## 2023-04-13 MED ORDER — CELECOXIB 200 MG PO CAPS: 200.0000 mg | ORAL_CAPSULE | Freq: Two times a day (BID) | ORAL | 1 refills | Status: DC

## 2023-04-13 MED ORDER — OXYCODONE HCL 5 MG PO TABS: 5.0000 mg | ORAL_TABLET | ORAL | 0 refills | Status: DC | PRN

## 2023-04-13 NOTE — Evaluation (Signed)
Occupational Therapy Evaluation Patient Details Name: Bridget Pollard MRN: [id number] DOB: 01/06/1956 Today's Date: 04/13/2023   History of Present Illness Pt is a 67 y.o. female s/p right posterior THA.   Clinical Impression   Bridget Pollard was seen for OT evaluation this date. Prior to hospital admission, pt was MOD I using SPC. Pt lives with spouse. Pt currently requires SUPERVISION don underwear/shorts using reacher, SUP for standing portion, good return demonstratin of posterior hip pcns. MOD I for ADL t/f using RW. Pt educated on functional application of posterior hip pcns and adapted dressing techniques; all education complete, will sign off. Upon hospital discharge, recommend no follow up OT.   Recommendations for follow up therapy are one component of a multi-disciplinary discharge planning process, led by the attending physician.  Recommendations may be updated based on patient status, additional functional criteria and insurance authorization.   Assistance Recommended at Discharge Set up Supervision/Assistance  Patient can return home with the following A little help with walking and/or transfers;A little help with bathing/dressing/bathroom;Help with stairs or ramp for entrance    Functional Status Assessment  Patient has had a recent decline in their functional status and demonstrates the ability to make significant improvements in function in a reasonable and predictable amount of time.  Equipment Recommendations  None recommended by OT    Recommendations for Other Services       Precautions / Restrictions Precautions Precautions: Posterior Hip Precaution Booklet Issued: Yes (comment) Restrictions Weight Bearing Restrictions: Yes RLE Weight Bearing: Weight bearing as tolerated      Mobility Bed Mobility Overal bed mobility: Modified Independent                  Transfers Overall transfer level: Modified independent Equipment used: Rolling walker (2 wheels)                       Balance Overall balance assessment: No apparent balance deficits (not formally assessed)                                         ADL either performed or assessed with clinical judgement   ADL Overall ADL's : Needs assistance/impaired                                       General ADL Comments: SUPERVISION don underwear/shorts using reacher, sup for standing portion, good return demonstratin of posterior hip pcns. MOD I for ADL t/f using RW      Pertinent Vitals/Pain Pain Assessment Pain Assessment: 0-10 Pain Score: 5  Pain Location: R hip Pain Descriptors / Indicators: Aching, Discomfort Pain Intervention(s): Limited activity within patient's tolerance, Premedicated before session     Hand Dominance     Extremity/Trunk Assessment Upper Extremity Assessment Upper Extremity Assessment: Overall WFL for tasks assessed   Lower Extremity Assessment Lower Extremity Assessment: Overall WFL for tasks assessed       Communication Communication Communication: No difficulties   Cognition Arousal/Alertness: Awake/alert Behavior During Therapy: WFL for tasks assessed/performed Overall Cognitive Status: Within Functional Limits for tasks assessed  Home Living Family/patient expects to be discharged to:: Private residence Living Arrangements: Spouse/significant other Available Help at Discharge: Family;Available 24 hours/day Type of Home: House Home Access: Stairs to enter [employer] of Steps: 3 Entrance Stairs-Rails: Right;Left Home Layout: One level     Bathroom Shower/Tub: Producer, television/film/video: Handicapped height Bathroom Accessibility: Yes   Home Equipment: Agricultural consultant (2 wheels);Cane - single point;Other (comment) (reacher)          Prior Functioning/Environment Prior Level of Function : Independent/Modified  Independent             Mobility Comments: having to use SPC due to R hip OA pain          OT Problem List: Decreased activity tolerance;Decreased range of motion         OT Goals(Current goals can be found in the care plan section) Acute Rehab OT Goals Patient Stated Goal: to go home OT Goal Formulation: With patient/family Time For Goal Achievement: 04/27/23 Potential to Achieve Goals: Good      Co-evaluation              AM-PAC OT "6 Clicks" Daily Activity     Outcome Measure Help from another person eating meals?: None Help from another person taking care of personal grooming?: None Help from another person toileting, which includes using toliet, bedpan, or urinal?: None Help from another person bathing (including washing, rinsing, drying)?: A Little Help from another person to put on and taking off regular upper body clothing?: None Help from another person to put on and taking off regular lower body clothing?: A Little 6 Click Score: 22   End of Session Equipment Utilized During Treatment: Rolling walker (2 wheels)  Activity Tolerance: Patient tolerated treatment well Patient left: in bed;with call bell/phone within reach;with nursing/sitter in room;with family/visitor present  OT Visit Diagnosis: Unsteadiness on feet (R26.81);Muscle weakness (generalized) (M62.81)                Time: 0981-1914 OT Time Calculation (min): 17 min Charges:  OT General Charges $OT Visit: 1 Visit OT Evaluation $OT Eval Low Complexity: 1 Low  Kathie Dike, M.S. OTR/L  04/13/23, 10:22 AM  ascom [phone]

## 2023-04-13 NOTE — Anesthesia Postprocedure Evaluation (Signed)
Anesthesia Post Note  Patient: Bridget Pollard  Procedure(s) Performed: TOTAL HIP ARTHROPLASTY (Right: Hip)  Patient location during evaluation: Nursing Unit Anesthesia Type: Spinal Level of consciousness: oriented and awake and alert Pain management: pain level controlled Vital Signs Assessment: post-procedure vital signs reviewed and stable Respiratory status: spontaneous breathing and respiratory function stable Cardiovascular status: blood pressure returned to baseline and stable Postop Assessment: no headache, no backache, no apparent nausea or vomiting and patient able to bend at knees Anesthetic complications: no Comments: Patient with complaints of numbness/cramping to right lateral lower leg but states "it is no different than it was before surgery". No other complaints.    No notable events documented.   Last Vitals:  Vitals:   04/13/23 0438 04/13/23 0827  BP: (!) 93/51 111/64  Pulse: 80 73  Resp: 20 18  Temp: 36.8 C 36.8 C  SpO2: 95% 95%    Last Pain:  Vitals:   04/12/23 2231  TempSrc:   PainSc: 5                  Hanna Aultman

## 2023-04-13 NOTE — Discharge Summary (Signed)
Physician Discharge Summary  Subjective: 1 Day Post-Op Procedure(s) (LRB): TOTAL HIP ARTHROPLASTY (Right) Patient reports pain as mild.   Patient seen in rounds with Dr. Ernest Pine. Patient is well, and has had no acute complaints or problems Patient is ready to go home  Physician Discharge Summary  Patient ID: Bridget Pollard MRN: [id number] DOB/AGE: 65-Feb-1957 65 y.o.  Admit date: 04/12/2023 Discharge date: 04/13/2023  Admission Diagnoses:  Discharge Diagnoses:  Principal Problem:   Hx of total hip arthroplasty, right   Discharged Condition: good  Hospital Course: Patient presented to the hospital on 04/12/2023 to have an elective right total hip arthroplasty performed by Dr. Ernest Pine.  Patient was prepped and cleared by anesthesia.  Was given 2 g Ancef and 1 of TXA perioperatively.  See operative notes below. Patient post operatively did well with therapy - was able to pass PT protocols the day after surgery. She does not wish to use lovenox at home - will only need to take ASA 81 mg BID. Discussed importance of continued TED hose use on this regimen. She has no complaints, and states that she is ready to go home.  PROCEDURE:  Right total hip arthroplasty   SURGEON:  Jena Gauss. M.D.   ASSISTANT:  Gean Birchwood, PA-C (present and scrubbed throughout the case, critical for assistance with exposure, retraction, instrumentation, and closure)   ANESTHESIA: spinal   ESTIMATED BLOOD LOSS: 100 mL   FLUIDS REPLACED: 1200 mL of crystalloid   DRAINS: 2 medium Hemovac drains   IMPLANTS UTILIZED: DePuy size 3 high offset Actis femoral stem, 50 mm OD Pinnacle 100 acetabular component, +4 mm neutral Pinnacle Altrx polyethylene insert, and a 32 mm CoCr +1 mm hip ball  Treatments: none  Discharge Exam: Blood pressure 111/64, pulse 73, temperature 98.2 F (36.8 C), resp. rate 18, height 5\' 3"  (1.6 m), weight 68 kg, SpO2 95 %.   Disposition: home    Allergies as of 04/13/2023        Reactions   Penicillins Hives   IgE = 208 (WNL) on 04/05/2023        Medication List     STOP taking these medications    meloxicam 15 MG tablet Commonly known as: MOBIC       TAKE these medications    aspirin EC 81 MG tablet Take 1 tablet (81 mg total) by mouth 2 (two) times daily. Swallow whole.   celecoxib 200 MG capsule Commonly known as: CELEBREX Take 1 capsule (200 mg total) by mouth 2 (two) times daily.   methocarbamol 750 MG tablet Commonly known as: Robaxin-750 Take 1 tablet (750 mg total) by mouth in the morning and at bedtime.   oxyCODONE 5 MG immediate release tablet Commonly known as: Oxy IR/ROXICODONE Take 1 tablet (5 mg total) by mouth every 4 (four) hours as needed for moderate pain (pain score 4-6).   traMADol 50 MG tablet Commonly known as: ULTRAM Take 1-2 tablets (50-100 mg total) by mouth every 4 (four) hours as needed for moderate pain.               Durable Medical Equipment  (From admission, onward)           Start     Ordered   04/12/23 1229  DME Walker rolling  Once       Question:  Patient needs a walker to treat with the following condition  Answer:  S/P total hip arthroplasty   04/12/23 1228   04/12/23  1229  DME Bedside commode  Once       Comments: Patient is not able to walk the distance required to go the bathroom, or he/she is unable to safely negotiate stairs required to access the bathroom.  A 3in1 BSC will alleviate this problem  Question:  Patient needs a bedside commode to treat with the following condition  Answer:  S/P total hip arthroplasty   04/12/23 1228            Follow-up Information     Hooten, Illene Labrador, MD Follow up on 05/25/2023.   Specialty: Orthopedic Surgery Why: at 10:30AM Contact information: [address] [facility] [place] [address] [phone]                 Signed: Gean Birchwood 04/13/2023, 8:30 AM   Objective: Vital signs in last 24 hours: Temp:   [97.4 F (36.3 C)-98.7 F (37.1 C)] 98.2 F (36.8 C) (05/16 0827) Pulse Rate:  [69-83] 73 (05/16 0827) Resp:  [13-20] 18 (05/16 0827) BP: (93-112)/(51-82) 111/64 (05/16 0827) SpO2:  [93 %-100 %] 95 % (05/16 0827)  Intake/Output from previous day:  Intake/Output Summary (Last 24 hours) at 04/13/2023 0830 Last data filed at 04/13/2023 0442 Gross per 24 hour  Intake 2841.94 ml  Output 760 ml  Net 2081.94 ml    Intake/Output this shift: No intake/output data recorded.  Labs: No results for input(s): "HGB" in the last 72 hours. No results for input(s): "WBC", "RBC", "HCT", "PLT" in the last 72 hours. No results for input(s): "NA", "K", "CL", "CO2", "BUN", "CREATININE", "GLUCOSE", "CALCIUM" in the last 72 hours. No results for input(s): "LABPT", "INR" in the last 72 hours.  EXAM: General - Patient is Alert, Appropriate, and Oriented Extremity - Neurologically intact ABD soft Neurovascular intact Sensation intact distally Intact pulses distally Dorsiflexion/Plantar flexion intact No cellulitis present Compartment soft Dressing - dressing C/D/I Motor Function - intact, moving foot and toes well on exam. Patient able to SLR with a little bit of weakness, but states improvement from yesterday while trying with physical therapy.  Patient is able to plantar and dorsiflex with good range of motion and strength.  Patient is neurovascularly intact to all lower leg dermatomes, pedal pulses appreciated (2+). JP Drain pulled without difficulty. Intact  Assessment/Plan: 1 Day Post-Op Procedure(s) (LRB): TOTAL HIP ARTHROPLASTY (Right) Procedure(s) (LRB): TOTAL HIP ARTHROPLASTY (Right) Past Medical History:  Diagnosis Date   Degenerative arthritis of hip 02/2023   right   Mixed hyperlipidemia    PONV (postoperative nausea and vomiting)    Principal Problem:   Hx of total hip arthroplasty, right  Estimated body mass index is 26.57 kg/m as calculated from the following:   Height as  of this encounter: 5\' 3"  (1.6 m).   Weight as of this encounter: 68 kg.   Patient urged to continue to move with physical therapy at home.  Discussed taking pain medications prior to working with physical therapy to help with any pain she may experience.  Patient passed PT protocols in hospital, clear for DC.   Discussed what medication she will be sent home with -including tramadol and oxycodone for pain.  Patient will also receive Celebrex to be taken twice daily to help with pain and swelling.   Is continuing to use TED hose for DVT prophylaxis as well as ice to help with swelling and inflammation.     DVT Prophylaxis - patient will be on 81 mg of ASA BID, continue  to wear TED hose. Weight-Bearing as tolerated to right leg   Patient will follow-up with [facility] clinic orthopedics in 6 weeks to have follow-up for reevaluation of her right hip. Staples pulled at 2 weeks with at home PT Weight Bearing As Tolerated Right Leg JP Drain Pulled, intact Hip Preacutions Discussed  Activity - WBAT Disposition - Home Condition Upon Discharge - Good, vitals stable  Patient stable for discharge   Danise Edge, PA-C Orthopaedic Surgery 04/13/2023, 8:30 AM

## 2023-04-13 NOTE — Progress Notes (Signed)
Physical Therapy Treatment Patient Details Name: Bridget Pollard MRN: [id number] DOB: Jul 11, 1956 Today's Date: 04/13/2023   History of Present Illness Pt is a 67 y.o. female s/p right posterior THA.    PT Comments    Pt received upright in recliner agreeable to PT services. Reports pain as mild, aware of post hip precautions as discussed yesterday. Pt performing STS and ambulation mod-I with RW with normalized, step through gait with little use of RW. Pt completing stair training with good understanding of LE sequencing after PT demo completing ~200' of gait. Pt returning to recliner educating on HEP handout (reps/sets/frequency) and reviewing car transfer and how it relates to maintaining posterior hip precautions. Pt verbalized understanding and with all needs in reach. Pt completing PT goals this date and safe to d/c at this time to next venue of care.     Recommendations for follow up therapy are one component of a multi-disciplinary discharge planning process, led by the attending physician.  Recommendations may be updated based on patient status, additional functional criteria and insurance authorization.     Assistance Recommended at Discharge Set up Supervision/Assistance  Patient can return home with the following A little help with walking and/or transfers;A little help with bathing/dressing/bathroom;Assistance with cooking/housework;Assist for transportation;Help with stairs or ramp for entrance   Equipment Recommendations  BSC/3in1    Recommendations for Other Services       Precautions / Restrictions Precautions Precautions: Posterior Hip Precaution Booklet Issued: Yes (comment) Restrictions Weight Bearing Restrictions: Yes RLE Weight Bearing: Weight bearing as tolerated     Mobility  Bed Mobility Overal bed mobility: Modified Independent               Patient Response: Cooperative  Transfers                   General transfer comment: NT. In recliner  pre and post session    Ambulation/Gait Ambulation/Gait assistance: Supervision Gait Distance (Feet): 200 Feet Assistive device: Rolling walker (2 wheels) Gait Pattern/deviations: Step-through pattern, Decreased step length - right, Decreased step length - left, Decreased stance time - right           Stairs Stairs: Yes Stairs assistance: Modified independent (Device/Increase time) Stair Management: Step to pattern, Forwards, One rail Right Number of Stairs: 4 General stair comments: asc/desc with correct sequencing and rail use   Wheelchair Mobility    Modified Rankin (Stroke Patients Only)       Balance Overall balance assessment: Mild deficits observed, not formally tested                                          Cognition                                                Exercises Total Joint Exercises Ankle Circles/Pumps: AROM, Strengthening, Both, 10 reps, Supine Quad Sets: AROM, Strengthening, Right, 10 reps, Seated Gluteal Sets: AROM, Strengthening, Both, 10 reps, Seated Towel Squeeze: AROM, Strengthening, Both, 10 reps, Supine Short Arc Quad: AROM, Strengthening, Right, 10 reps, Supine Heel Slides: AAROM, Strengthening, Right, 10 reps, Supine Hip ABduction/ADduction: AROM, Strengthening, Right, 10 reps, Supine Long Arc Quad: AROM, Right, 10 reps, Seated Other Exercises Other Exercises: reviewed car transfer and  provided formal HEP    General Comments        Pertinent Vitals/Pain Pain Assessment Pain Assessment: 0-10 Pain Score: 2  Pain Location: R hip Pain Descriptors / Indicators: Aching, Discomfort Pain Intervention(s): Limited activity within patient's tolerance, Premedicated before session    Home Living Family/patient expects to be discharged to:: Private residence Living Arrangements: Spouse/significant other Available Help at Discharge: Family;Available 24 hours/day Type of Home: House Home Access:  Stairs to enter Entrance Stairs-Rails: Doctor, general practice of Steps: 3   Home Layout: One level Home Equipment: Agricultural consultant (2 wheels);Cane - single point;Other (comment) Lexicographer)      Prior Function            PT Goals (current goals can now be found in the care plan section) Acute Rehab PT Goals Patient Stated Goal: to go home PT Goal Formulation: With patient Time For Goal Achievement: 04/26/23 Potential to Achieve Goals: Good Progress towards PT goals: Progressing toward goals    Frequency    BID      PT Plan Current plan remains appropriate    Co-evaluation              AM-PAC PT "6 Clicks" Mobility   Outcome Measure  Help needed turning from your back to your side while in a flat bed without using bedrails?: None Help needed moving from lying on your back to sitting on the side of a flat bed without using bedrails?: None Help needed moving to and from a bed to a chair (including a wheelchair)?: A Little Help needed standing up from a chair using your arms (e.g., wheelchair or bedside chair)?: A Little Help needed to walk in hospital room?: A Little Help needed climbing 3-5 steps with a railing? : A Little 6 Click Score: 20    End of Session Equipment Utilized During Treatment: Gait belt Activity Tolerance: Patient tolerated treatment well Patient left: in chair;with call bell/phone within reach;with chair alarm set Nurse Communication: Mobility status PT Visit Diagnosis: Muscle weakness (generalized) (M62.81);Pain Pain - Right/Left: Right Pain - part of body: Hip     Time: 0840-0900 PT Time Calculation (min) (ACUTE ONLY): 20 min  Charges:  $Therapeutic Exercise: 8-22 mins                    Delphia Grates. Fairly IV, PT, DPT Physical Therapist- [place]  [facility]  04/13/2023, 10:31 AM

## 2023-04-13 NOTE — Progress Notes (Signed)
Discharge instructions reviewed with patient including followup visits, post op instructions and new medications.  Understanding was verbalized and all questions were answered.  IV removed without complication; patient tolerated well.  Patient discharged home via wheelchair in stable condition escorted by nursing staff.

## 2023-04-13 NOTE — Progress Notes (Signed)
Subjective: 1 Day Post-Op Procedure(s) (LRB): TOTAL HIP ARTHROPLASTY (Right) Patient reports pain as mild.  Worse while working with PT Patient seen in rounds with Dr. Ernest Pine. Patient is well, and has had no acute complaints or problems We will start therapy today.  Plan is to go Home after hospital stay.  Objective: Vital signs in last 24 hours: Temp:  [97.4 F (36.3 C)-98.7 F (37.1 C)] 98.2 F (36.8 C) (05/16 0438) Pulse Rate:  [69-83] 80 (05/16 0438) Resp:  [13-20] 20 (05/16 0438) BP: (93-112)/(51-82) 93/51 (05/16 0438) SpO2:  [93 %-100 %] 95 % (05/16 0438)  Intake/Output from previous day:  Intake/Output Summary (Last 24 hours) at 04/13/2023 0823 Last data filed at 04/13/2023 0442 Gross per 24 hour  Intake 2841.94 ml  Output 760 ml  Net 2081.94 ml    Intake/Output this shift: No intake/output data recorded.  Labs: No results for input(s): "HGB" in the last 72 hours. No results for input(s): "WBC", "RBC", "HCT", "PLT" in the last 72 hours. No results for input(s): "NA", "K", "CL", "CO2", "BUN", "CREATININE", "GLUCOSE", "CALCIUM" in the last 72 hours. No results for input(s): "LABPT", "INR" in the last 72 hours.  EXAM General - Patient is Alert, Appropriate, and Oriented Extremity - Neurologically intact ABD soft Neurovascular intact Sensation intact distally Intact pulses distally Dorsiflexion/Plantar flexion intact No cellulitis present Compartment soft Dressing - dressing C/D/I Motor Function - intact, moving foot and toes well on exam. Patient able to SLR with a little bit of weakness, but states improvement from yesterday while trying with physical therapy.  Patient is able to plantar and dorsiflex with good range of motion and strength.  Patient is neurovascularly intact to all lower leg dermatomes, pedal pulses appreciated (2+). JP Drain pulled without difficulty. Intact  Past Medical History:  Diagnosis Date   Degenerative arthritis of hip 02/2023    right   Mixed hyperlipidemia    PONV (postoperative nausea and vomiting)     Assessment/Plan: 1 Day Post-Op Procedure(s) (LRB): TOTAL HIP ARTHROPLASTY (Right) Principal Problem:   Hx of total hip arthroplasty, right  Estimated body mass index is 26.57 kg/m as calculated from the following:   Height as of this encounter: 5\' 3"  (1.6 m).   Weight as of this encounter: 68 kg. Advance diet Up with therapy   Patient urged to continue to move with physical therapy today.  Discussed taking pain medications prior to working with physical therapy to help with any pain she may experience.  Wait to see if she passes PT protocols prior to discharge  Discussed what medication she will be sent home with -including tramadol and oxycodone for pain.  Patient will also receive Celebrex to be taken twice daily to help with pain and swelling.  Is continuing to use TED hose for DVT prophylaxis as well as ice to help with swelling and inflammation.    DVT Prophylaxis - patient will be on 81 mg of ASA BID, continue to wear TED hose. Weight-Bearing as tolerated to right leg  Patient will follow-up with [facility] clinic orthopedics in 6 weeks to have follow-up for reevaluation of her right hip. Staples pulled at 2 weeks with at home PT Weight Bearing As Tolerated Right Leg JP Drain Pulled, intact Hip Preacutions Discussed   Rayburn Go, PA-C [facility] Orthopaedics 04/13/2023, 8:23 AM

## 2023-04-13 NOTE — Progress Notes (Signed)
Patient is not able to walk the distance required to go the bathroom, or he/she is unable to safely negotiate stairs required to access the bathroom.  A 3in1 BSC will alleviate this problem   Bridget Pollard, Jr. M.D.  

## 2023-04-13 NOTE — TOC Progression Note (Signed)
Transition of Care [facility]) - Progression Note    Patient Details  Name: Bridget Pollard MRN: [id number] Date of Birth: June 09, 1956  Transition of Care [facility]) CM/SW Contact  Marlowe Sax, RN Phone Number: 04/13/2023, 8:55 AM  Clinical Narrative:    Spoke with the patient, she lives at home with her spouse, She has a rolling walker and does not feel that she will need a 3 in1, She is set up with [facility] services   Expected Discharge Plan: Home w Home Health Services Barriers to Discharge: No Barriers Identified  Expected Discharge Plan and Services   Discharge Planning Services: CM Consult   Living arrangements for the past 2 months: Single Family Home Expected Discharge Date: 04/13/23               DME Arranged: N/A DME Agency: NA       HH Arranged: PT, OT HH Agency: [facility] Date HH Agency Contacted: 04/13/23 Time HH Agency Contacted: [phone] Representative spoke with at [facility] Agency: [place]   Social Determinants of Health (SDOH) Interventions SDOH Screenings   Food Insecurity: No Food Insecurity (04/12/2023)  Housing: Low Risk  (04/12/2023)  Transportation Needs: No Transportation Needs (04/12/2023)  Utilities: Not At Risk (04/12/2023)  Depression (PHQ2-9): Low Risk  (12/02/2022)  Tobacco Use: Medium Risk (04/12/2023)    Readmission Risk Interventions     No data to display

## 2023-04-14 DIAGNOSIS — Z96641 Presence of right artificial hip joint: Secondary | ICD-10-CM | POA: Diagnosis not present

## 2023-04-14 DIAGNOSIS — Z471 Aftercare following joint replacement surgery: Secondary | ICD-10-CM | POA: Diagnosis not present

## 2023-04-14 DIAGNOSIS — E782 Mixed hyperlipidemia: Secondary | ICD-10-CM | POA: Diagnosis not present

## 2023-04-14 DIAGNOSIS — Z791 Long term (current) use of non-steroidal anti-inflammatories (NSAID): Secondary | ICD-10-CM | POA: Diagnosis not present

## 2023-04-14 DIAGNOSIS — Z7982 Long term (current) use of aspirin: Secondary | ICD-10-CM | POA: Diagnosis not present

## 2023-04-17 ENCOUNTER — Other Ambulatory Visit: Payer: Self-pay | Admitting: Family Medicine

## 2023-04-17 DIAGNOSIS — M25551 Pain in right hip: Secondary | ICD-10-CM

## 2023-04-17 DIAGNOSIS — Z471 Aftercare following joint replacement surgery: Secondary | ICD-10-CM | POA: Diagnosis not present

## 2023-04-17 DIAGNOSIS — Z96641 Presence of right artificial hip joint: Secondary | ICD-10-CM | POA: Diagnosis not present

## 2023-04-17 DIAGNOSIS — Z791 Long term (current) use of non-steroidal anti-inflammatories (NSAID): Secondary | ICD-10-CM | POA: Diagnosis not present

## 2023-04-17 DIAGNOSIS — Z7982 Long term (current) use of aspirin: Secondary | ICD-10-CM | POA: Diagnosis not present

## 2023-04-17 DIAGNOSIS — E782 Mixed hyperlipidemia: Secondary | ICD-10-CM | POA: Diagnosis not present

## 2023-04-17 NOTE — Telephone Encounter (Signed)
Pt called saying she had total hip rt replacement.  Dr. Dorna Bloom office asked that she call her primay and have then represcribe the Robaxin generic medication  [facility] [address]  CB#  [phone]

## 2023-04-18 ENCOUNTER — Other Ambulatory Visit: Payer: Self-pay | Admitting: Family Medicine

## 2023-04-18 DIAGNOSIS — M25551 Pain in right hip: Secondary | ICD-10-CM

## 2023-04-18 MED ORDER — METHOCARBAMOL 750 MG PO TABS: 750.0000 mg | ORAL_TABLET | Freq: Two times a day (BID) | ORAL | 1 refills | Status: DC

## 2023-04-18 NOTE — Telephone Encounter (Signed)
Requested medication (s) are due for refill today-yes  Requested medication (s) are on the active medication list -yes  Future visit scheduled -no  Last refill: 07/15/22 #180 1RF  Notes to clinic: non delegated Rx,  Duplicate request  Requested Prescriptions  Pending Prescriptions Disp Refills   methocarbamol (ROBAXIN) 750 MG tablet [Pharmacy Med Name: Methocarbamol 750 MG Oral Tablet] 180 tablet 0    Sig: TAKE 1 TABLET BY MOUTH IN THE MORNING AND AT BEDTIME     Not Delegated - Analgesics:  Muscle Relaxants Failed - 04/18/2023  1:01 PM      Failed - This refill cannot be delegated      Passed - Valid encounter within last 6 months    Recent Outpatient Visits           4 months ago Chronic right hip pain   [facility] [name], [name]   10 months ago Pain of right hip   [facility] [name], [name]   6 years ago Encounter for biometric screening   [place] [facility] [place], [name], [name]   7 years ago Annual physical exam   [facility] [name], MD   7 years ago Cough   [facility] [name], MD                 Requested Prescriptions  Pending Prescriptions Disp Refills   methocarbamol (ROBAXIN) 750 MG tablet [Pharmacy Med Name: Methocarbamol 750 MG Oral Tablet] 180 tablet 0    Sig: TAKE 1 TABLET BY MOUTH IN THE MORNING AND AT BEDTIME     Not Delegated - Analgesics:  Muscle Relaxants Failed - 04/18/2023  1:01 PM      Failed - This refill cannot be delegated      Passed - Valid encounter within last 6 months    Recent Outpatient Visits           4 months ago Chronic right hip pain   [facility] [name], [name]   10 months ago Pain of right hip   [facility] [name], [name]   6 years ago Encounter for biometric screening   [facility] [place], [name], [name]   7 years ago Annual physical exam   [facility] [name], MD   7 years ago Cough   [facility] [name], MD

## 2023-04-18 NOTE — Telephone Encounter (Signed)
Requested medication (s) are due for refill today - no  Requested medication (s) are on the active medication list -yes  Future visit scheduled -no  Last refill: 07/15/22 #180 1RF  Notes to clinic: non delegated Rx,  Pt called saying she had total hip rt replacement.  Dr. Dorna Bloom office asked that she call her primay and have then represcribe the Robaxin generic medication    Requested Prescriptions  Pending Prescriptions Disp Refills   methocarbamol (ROBAXIN-750) 750 MG tablet 180 tablet 1    Sig: Take 1 tablet (750 mg total) by mouth in the morning and at bedtime.     Not Delegated - Analgesics:  Muscle Relaxants Failed - 04/17/2023  3:01 PM      Failed - This refill cannot be delegated      Passed - Valid encounter within last 6 months    Recent Outpatient Visits           4 months ago Chronic right hip pain   [facility] [name], [name]   10 months ago Pain of right hip   [facility] [name], [name]   6 years ago Encounter for biometric screening   [facility] [name], [name], [name]   7 years ago Annual physical exam   [facility] [name], MD   7 years ago Cough   [place] [facility] [name], MD                 Requested Prescriptions  Pending Prescriptions Disp Refills   methocarbamol (ROBAXIN-750) 750 MG tablet 180 tablet 1    Sig: Take 1 tablet (750 mg total) by mouth in the morning and at bedtime.     Not Delegated - Analgesics:  Muscle Relaxants Failed - 04/17/2023  3:01 PM      Failed - This refill cannot be delegated      Passed - Valid encounter within last 6 months    Recent Outpatient Visits           4 months ago Chronic right hip pain   [facility] [name], [name]   10 months ago Pain of right hip   [facility] [name], [name]   6  years ago Encounter for biometric screening   [facility] [name], [name], [name]   7 years ago Annual physical exam   [facility] [name], MD   7 years ago Cough   [facility] [name], MD

## 2023-04-20 DIAGNOSIS — Z471 Aftercare following joint replacement surgery: Secondary | ICD-10-CM | POA: Diagnosis not present

## 2023-04-20 DIAGNOSIS — Z96641 Presence of right artificial hip joint: Secondary | ICD-10-CM | POA: Diagnosis not present

## 2023-04-20 DIAGNOSIS — E782 Mixed hyperlipidemia: Secondary | ICD-10-CM | POA: Diagnosis not present

## 2023-04-20 DIAGNOSIS — Z791 Long term (current) use of non-steroidal anti-inflammatories (NSAID): Secondary | ICD-10-CM | POA: Diagnosis not present

## 2023-04-20 DIAGNOSIS — Z7982 Long term (current) use of aspirin: Secondary | ICD-10-CM | POA: Diagnosis not present

## 2023-04-26 DIAGNOSIS — Z7982 Long term (current) use of aspirin: Secondary | ICD-10-CM | POA: Diagnosis not present

## 2023-04-26 DIAGNOSIS — E782 Mixed hyperlipidemia: Secondary | ICD-10-CM | POA: Diagnosis not present

## 2023-04-26 DIAGNOSIS — Z471 Aftercare following joint replacement surgery: Secondary | ICD-10-CM | POA: Diagnosis not present

## 2023-04-26 DIAGNOSIS — Z791 Long term (current) use of non-steroidal anti-inflammatories (NSAID): Secondary | ICD-10-CM | POA: Diagnosis not present

## 2023-04-26 DIAGNOSIS — Z96641 Presence of right artificial hip joint: Secondary | ICD-10-CM | POA: Diagnosis not present

## 2023-04-28 DIAGNOSIS — Z791 Long term (current) use of non-steroidal anti-inflammatories (NSAID): Secondary | ICD-10-CM | POA: Diagnosis not present

## 2023-04-28 DIAGNOSIS — Z7982 Long term (current) use of aspirin: Secondary | ICD-10-CM | POA: Diagnosis not present

## 2023-04-28 DIAGNOSIS — Z96641 Presence of right artificial hip joint: Secondary | ICD-10-CM | POA: Diagnosis not present

## 2023-04-28 DIAGNOSIS — Z471 Aftercare following joint replacement surgery: Secondary | ICD-10-CM | POA: Diagnosis not present

## 2023-04-28 DIAGNOSIS — E782 Mixed hyperlipidemia: Secondary | ICD-10-CM | POA: Diagnosis not present

## 2023-04-29 DIAGNOSIS — R7309 Other abnormal glucose: Secondary | ICD-10-CM | POA: Diagnosis not present

## 2023-05-02 DIAGNOSIS — E782 Mixed hyperlipidemia: Secondary | ICD-10-CM | POA: Diagnosis not present

## 2023-05-02 DIAGNOSIS — Z791 Long term (current) use of non-steroidal anti-inflammatories (NSAID): Secondary | ICD-10-CM | POA: Diagnosis not present

## 2023-05-02 DIAGNOSIS — Z96641 Presence of right artificial hip joint: Secondary | ICD-10-CM | POA: Diagnosis not present

## 2023-05-02 DIAGNOSIS — Z7982 Long term (current) use of aspirin: Secondary | ICD-10-CM | POA: Diagnosis not present

## 2023-05-02 DIAGNOSIS — Z471 Aftercare following joint replacement surgery: Secondary | ICD-10-CM | POA: Diagnosis not present

## 2023-05-04 DIAGNOSIS — E782 Mixed hyperlipidemia: Secondary | ICD-10-CM | POA: Diagnosis not present

## 2023-05-04 DIAGNOSIS — Z791 Long term (current) use of non-steroidal anti-inflammatories (NSAID): Secondary | ICD-10-CM | POA: Diagnosis not present

## 2023-05-04 DIAGNOSIS — Z96641 Presence of right artificial hip joint: Secondary | ICD-10-CM | POA: Diagnosis not present

## 2023-05-04 DIAGNOSIS — Z471 Aftercare following joint replacement surgery: Secondary | ICD-10-CM | POA: Diagnosis not present

## 2023-05-04 DIAGNOSIS — Z7982 Long term (current) use of aspirin: Secondary | ICD-10-CM | POA: Diagnosis not present

## 2023-05-08 DIAGNOSIS — Z471 Aftercare following joint replacement surgery: Secondary | ICD-10-CM | POA: Diagnosis not present

## 2023-05-09 DIAGNOSIS — E782 Mixed hyperlipidemia: Secondary | ICD-10-CM | POA: Diagnosis not present

## 2023-05-09 DIAGNOSIS — Z7982 Long term (current) use of aspirin: Secondary | ICD-10-CM | POA: Diagnosis not present

## 2023-05-09 DIAGNOSIS — Z96641 Presence of right artificial hip joint: Secondary | ICD-10-CM | POA: Diagnosis not present

## 2023-05-09 DIAGNOSIS — Z471 Aftercare following joint replacement surgery: Secondary | ICD-10-CM | POA: Diagnosis not present

## 2023-05-09 DIAGNOSIS — Z791 Long term (current) use of non-steroidal anti-inflammatories (NSAID): Secondary | ICD-10-CM | POA: Diagnosis not present

## 2023-05-15 DIAGNOSIS — Z471 Aftercare following joint replacement surgery: Secondary | ICD-10-CM | POA: Diagnosis not present

## 2023-05-15 DIAGNOSIS — Z96641 Presence of right artificial hip joint: Secondary | ICD-10-CM | POA: Diagnosis not present

## 2023-05-15 DIAGNOSIS — E782 Mixed hyperlipidemia: Secondary | ICD-10-CM | POA: Diagnosis not present

## 2023-05-15 DIAGNOSIS — Z791 Long term (current) use of non-steroidal anti-inflammatories (NSAID): Secondary | ICD-10-CM | POA: Diagnosis not present

## 2023-05-15 DIAGNOSIS — Z7982 Long term (current) use of aspirin: Secondary | ICD-10-CM | POA: Diagnosis not present

## 2023-05-23 DIAGNOSIS — Z791 Long term (current) use of non-steroidal anti-inflammatories (NSAID): Secondary | ICD-10-CM | POA: Diagnosis not present

## 2023-05-23 DIAGNOSIS — E782 Mixed hyperlipidemia: Secondary | ICD-10-CM | POA: Diagnosis not present

## 2023-05-23 DIAGNOSIS — Z7982 Long term (current) use of aspirin: Secondary | ICD-10-CM | POA: Diagnosis not present

## 2023-05-23 DIAGNOSIS — Z471 Aftercare following joint replacement surgery: Secondary | ICD-10-CM | POA: Diagnosis not present

## 2023-05-23 DIAGNOSIS — Z96641 Presence of right artificial hip joint: Secondary | ICD-10-CM | POA: Diagnosis not present

## 2023-05-25 DIAGNOSIS — Z96641 Presence of right artificial hip joint: Secondary | ICD-10-CM | POA: Diagnosis not present

## 2023-05-29 DIAGNOSIS — R7309 Other abnormal glucose: Secondary | ICD-10-CM | POA: Diagnosis not present

## 2023-05-29 DIAGNOSIS — Z791 Long term (current) use of non-steroidal anti-inflammatories (NSAID): Secondary | ICD-10-CM | POA: Diagnosis not present

## 2023-05-29 DIAGNOSIS — Z7982 Long term (current) use of aspirin: Secondary | ICD-10-CM | POA: Diagnosis not present

## 2023-05-29 DIAGNOSIS — E782 Mixed hyperlipidemia: Secondary | ICD-10-CM | POA: Diagnosis not present

## 2023-05-29 DIAGNOSIS — Z96641 Presence of right artificial hip joint: Secondary | ICD-10-CM | POA: Diagnosis not present

## 2023-05-29 DIAGNOSIS — Z471 Aftercare following joint replacement surgery: Secondary | ICD-10-CM | POA: Diagnosis not present

## 2023-06-15 ENCOUNTER — Encounter: Payer: Self-pay | Admitting: Family Medicine

## 2023-06-15 ENCOUNTER — Ambulatory Visit (INDEPENDENT_AMBULATORY_CARE_PROVIDER_SITE_OTHER): Payer: BC Managed Care – PPO | Admitting: Family Medicine

## 2023-06-15 VITALS — BP 106/68 | HR 66 | Ht 64.0 in | Wt 158.8 lb

## 2023-06-15 DIAGNOSIS — R7303 Prediabetes: Secondary | ICD-10-CM

## 2023-06-15 DIAGNOSIS — Z0000 Encounter for general adult medical examination without abnormal findings: Secondary | ICD-10-CM

## 2023-06-15 DIAGNOSIS — Z96641 Presence of right artificial hip joint: Secondary | ICD-10-CM

## 2023-06-15 DIAGNOSIS — E782 Mixed hyperlipidemia: Secondary | ICD-10-CM

## 2023-06-15 DIAGNOSIS — Z1231 Encounter for screening mammogram for malignant neoplasm of breast: Secondary | ICD-10-CM

## 2023-06-15 DIAGNOSIS — Z780 Asymptomatic menopausal state: Secondary | ICD-10-CM | POA: Diagnosis not present

## 2023-06-15 DIAGNOSIS — F411 Generalized anxiety disorder: Secondary | ICD-10-CM | POA: Diagnosis not present

## 2023-06-15 DIAGNOSIS — Z87891 Personal history of nicotine dependence: Secondary | ICD-10-CM

## 2023-06-15 MED ORDER — ALPRAZOLAM 0.5 MG PO TABS: 0.5000 mg | ORAL_TABLET | Freq: Every evening | ORAL | 0 refills | Status: DC | PRN

## 2023-06-15 NOTE — Assessment & Plan Note (Signed)
Chronic; repeat LP The 10-year ASCVD risk score (Arnett DK, et al., 2019) is: 4.6% recommend diet low in saturated fat and regular exercise - 30 min at least 5 times per week

## 2023-06-15 NOTE — Assessment & Plan Note (Signed)
Chronic; impaired sleep patterns and relaxation patters May be up for 5-7 days at a time Low dose benzo to assist Consider daily medication if more frequent episodes arise

## 2023-06-15 NOTE — Progress Notes (Signed)
Complete physical exam  Patient: Bridget Pollard   DOB: 1956/11/08   67 y.o. Female  MRN: [id number] Visit Date: 06/15/2023  Today's healthcare provider: [name], [name]  Introduced to nurse practitioner role and practice setting.  All questions answered.  Discussed provider/patient relationship and expectations.  Chief Complaint  Patient presents with   Annual Exam   Subjective    Bridget Pollard is a 67 y.o. female who presents today for a complete physical exam.  She reports consuming a general diet.  Walking for exercise  She generally feels fairly well. She reports sleeping poorly. She does have additional problems to discuss today.  HPI HPI   Patient says she recently had a hip replacement surgery on 04/12/23. Patient says she would like to discuss current medication Methocarbamol at today's visit. Last edited by Malen Gauze, CMA on 06/15/2023  9:45 AM.      Past Medical History:  Diagnosis Date   Degenerative arthritis of hip 02/2023   right   Mixed hyperlipidemia    PONV (postoperative nausea and vomiting)    Past Surgical History:  Procedure Laterality Date   COSMETIC SURGERY     Neck Lift   MOUTH SURGERY     bridge   SHOULDER SURGERY Left 11/29/1979   [facility] joint   TOTAL HIP ARTHROPLASTY Right 04/12/2023   Procedure: TOTAL HIP ARTHROPLASTY;  Surgeon: Donato Heinz, MD;  Location: [facility] ORS;  Service: Orthopedics;  Laterality: Right;   Social History   Socioeconomic History   Marital status: Married    Spouse name: Loraine Leriche   Number of children: 0   Years of education: PhD   Highest education level: Not on file  Occupational History   Occupation: Full Time Teacher  Tobacco Use   Smoking status: Former    Current packs/day: 0.00    Types: Cigarettes    Quit date: 11/28/1979    Years since quitting: 43.5   Smokeless tobacco: Never  Vaping Use   Vaping status: Never Used  Substance and Sexual Activity   Alcohol use: Yes    Alcohol/week: 2.0 standard  drinks of alcohol    Types: 2 Glasses of wine per week    Comment: occasional   Drug use: No   Sexual activity: Not on file  Other Topics Concern   Not on file  Social History Narrative   Not on file   Social Determinants of Health   Financial Resource Strain: Not on file  Food Insecurity: No Food Insecurity (04/12/2023)   Hunger Vital Sign    Worried About Running Out of Food in the Last Year: Never true    Ran Out of Food in the Last Year: Never true  Transportation Needs: No Transportation Needs (04/12/2023)   PRAPARE - Administrator, Civil Service (Medical): No    Lack of Transportation (Non-Medical): No  Physical Activity: Not on file  Stress: Not on file  Social Connections: Not on file  Intimate Partner Violence: Not At Risk (04/12/2023)   Humiliation, Afraid, Rape, and Kick questionnaire    Fear of Current or Ex-Partner: No    Emotionally Abused: No    Physically Abused: No    Sexually Abused: No   Family Status  Relation Name Status   Mother  Deceased at age 23   Father  Deceased at age 54       MI   Sister  Alive   Brother  Alive   Brother  Deceased at age 21 months old       atrial septal defect  No partnership data on file   Family History  Problem Relation Age of Onset   COPD Mother    Stroke Father    Prostate cancer Father    Heart attack Father    Skin cancer Father    Hypertension Sister    Skin cancer Sister    Diabetes Brother    Heart disease Brother    Allergies  Allergen Reactions   Penicillins Hives    IgE = 208 (WNL) on 04/05/2023    Patient Care Team: [name], [name] as PCP - General (Family Medicine)   Medications: Outpatient Medications Prior to Visit  Medication Sig   methocarbamol (ROBAXIN-750) 750 MG tablet Take 1 tablet (750 mg total) by mouth in the morning and at bedtime.   [DISCONTINUED] aspirin EC 81 MG tablet Take 1 tablet (81 mg total) by mouth 2 (two) times daily. Swallow whole. (Patient not taking:  Reported on 06/15/2023)   [DISCONTINUED] celecoxib (CELEBREX) 200 MG capsule Take 1 capsule (200 mg total) by mouth 2 (two) times daily. (Patient not taking: Reported on 06/15/2023)   [DISCONTINUED] oxyCODONE (OXY IR/ROXICODONE) 5 MG immediate release tablet Take 1 tablet (5 mg total) by mouth every 4 (four) hours as needed for moderate pain (pain score 4-6). (Patient not taking: Reported on 06/15/2023)   [DISCONTINUED] traMADol (ULTRAM) 50 MG tablet Take 1-2 tablets (50-100 mg total) by mouth every 4 (four) hours as needed for moderate pain. (Patient not taking: Reported on 06/15/2023)   No facility-administered medications prior to visit.    Objective    BP 106/68   Pulse 66   Ht 5\' 4"  (1.626 m)   Wt 158 lb 12.8 oz (72 kg)   SpO2 98%   BMI 27.26 kg/m   Physical Exam Vitals and nursing note reviewed.  Constitutional:      General: She is awake. She is not in acute distress.    Appearance: Normal appearance. She is well-developed and well-groomed. She is obese. She is not ill-appearing, toxic-appearing or diaphoretic.  HENT:     Head: Normocephalic and atraumatic.     Jaw: There is normal jaw occlusion. No trismus, tenderness, swelling or pain on movement.     Right Ear: Hearing, tympanic membrane, ear canal and external ear normal. There is no impacted cerumen.     Left Ear: Hearing, tympanic membrane, ear canal and external ear normal. There is no impacted cerumen.     Nose: Nose normal. No congestion or rhinorrhea.     Right Turbinates: Not enlarged, swollen or pale.     Left Turbinates: Not enlarged, swollen or pale.     Right Sinus: No maxillary sinus tenderness or frontal sinus tenderness.     Left Sinus: No maxillary sinus tenderness or frontal sinus tenderness.     Mouth/Throat:     Lips: Pink.     Mouth: Mucous membranes are moist. No injury.     Tongue: No lesions.     Pharynx: Oropharynx is clear. Uvula midline. No pharyngeal swelling, oropharyngeal exudate, posterior  oropharyngeal erythema or uvula swelling.     Tonsils: No tonsillar exudate or tonsillar abscesses.  Eyes:     General: Lids are normal. Lids are everted, no foreign bodies appreciated. Vision grossly intact. Gaze aligned appropriately. No allergic shiner or visual field deficit.       Right eye: No discharge.  Left eye: No discharge.     Extraocular Movements: Extraocular movements intact.     Conjunctiva/sclera: Conjunctivae normal.     Right eye: Right conjunctiva is not injected. No exudate.    Left eye: Left conjunctiva is not injected. No exudate.    Pupils: Pupils are equal, round, and reactive to light.  Neck:     Thyroid: No thyroid mass, thyromegaly or thyroid tenderness.     Vascular: No carotid bruit.     Trachea: Trachea normal.  Cardiovascular:     Rate and Rhythm: Normal rate and regular rhythm.     Pulses: Normal pulses.          Carotid pulses are 2+ on the right side and 2+ on the left side.      Radial pulses are 2+ on the right side and 2+ on the left side.       Dorsalis pedis pulses are 2+ on the right side and 2+ on the left side.       Posterior tibial pulses are 2+ on the right side and 2+ on the left side.     Heart sounds: Normal heart sounds, S1 normal and S2 normal. No murmur heard.    No friction rub. No gallop.  Pulmonary:     Effort: Pulmonary effort is normal. No respiratory distress.     Breath sounds: Normal breath sounds and air entry. No stridor. No wheezing, rhonchi or rales.  Chest:     Chest wall: No tenderness.  Abdominal:     General: Abdomen is flat. Bowel sounds are normal. There is no distension.     Palpations: Abdomen is soft. There is no mass.     Tenderness: There is no abdominal tenderness. There is no right CVA tenderness, left CVA tenderness, guarding or rebound.     Hernia: No hernia is present.  Genitourinary:    Comments: Exam deferred; denies complaints Musculoskeletal:        General: No swelling, tenderness,  deformity or signs of injury. Normal range of motion.     Cervical back: Full passive range of motion without pain, normal range of motion and neck supple. No edema, rigidity or tenderness. No muscular tenderness.     Right lower leg: No edema.     Left lower leg: No edema.     Comments: Slight catch in R hip s/p fracture and replacement; has appt at ortho later today to discuss further  Lymphadenopathy:     Cervical: No cervical adenopathy.     Right cervical: No superficial, deep or posterior cervical adenopathy.    Left cervical: No superficial, deep or posterior cervical adenopathy.  Skin:    General: Skin is warm and dry.     Capillary Refill: Capillary refill takes less than 2 seconds.     Coloration: Skin is not jaundiced or pale.     Findings: No bruising, erythema, lesion or rash.  Neurological:     General: No focal deficit present.     Mental Status: She is alert and oriented to person, place, and time. Mental status is at baseline.     GCS: GCS eye subscore is 4. GCS verbal subscore is 5. GCS motor subscore is 6.     Sensory: Sensation is intact. No sensory deficit.     Motor: Motor function is intact. No weakness.     Coordination: Coordination is intact. Coordination normal.     Gait: Gait is intact. Gait normal.  Psychiatric:  Attention and Perception: Attention and perception normal.        Mood and Affect: Mood and affect normal.        Speech: Speech normal.        Behavior: Behavior normal. Behavior is cooperative.        Thought Content: Thought content normal.        Cognition and Memory: Cognition and memory normal.        Judgment: Judgment normal.     Last depression screening scores    06/15/2023    9:49 AM 12/02/2022    8:56 AM 06/22/2022    8:38 AM  PHQ 2/9 Scores  PHQ - 2 Score 0 0 1  PHQ- 9 Score 2 0 5   Last fall risk screening    06/15/2023    9:48 AM  Fall Risk   Falls in the past year? 0  Number falls in past yr: 0  Injury with Fall?  0  Risk for fall due to : No Fall Risks  Follow up Falls evaluation completed   Last Audit-C alcohol use screening    06/15/2023    9:48 AM  Alcohol Use Disorder Test (AUDIT)  1. How often do you have a drink containing alcohol? 2  2. How many drinks containing alcohol do you have on a typical day when you are drinking? 0  3. How often do you have six or more drinks on one occasion? 0  AUDIT-C Score 2   A score of 3 or more in women, and 4 or more in men indicates increased risk for alcohol abuse, EXCEPT if all of the points are from question 1   No results found for any visits on 06/15/23.  Assessment & Plan    Routine Health Maintenance and Physical Exam  Exercise Activities and Dietary recommendations  Goals   None     Immunization History  Administered Date(s) Administered   Hepatitis A 03/25/2015, 01/21/2016   Hepatitis B 03/25/2015, 01/21/2016, 11/17/2017   Hepatitis B, ADULT 11/17/2017   Influenza,inj,Quad PF,6+ Mos 08/07/2019   Influenza-Unspecified 09/19/2017, 09/18/2018, 08/05/2020, 12/30/2022   MMR 09/24/2018   Td 12/16/2005   Tdap 07/20/2016   Typhoid Live 04/13/2015    Health Maintenance  Topic Date Due   Medicare Annual Wellness (AWV)  Never done   Zoster Vaccines- Shingrix (1 of 2) Never done   COVID-19 Vaccine (1 - 2023-24 season) Never done   Pneumonia Vaccine 6+ Years old (1 of 1 - PCV) Never done   DEXA SCAN  Never done   INFLUENZA VACCINE  06/29/2023   MAMMOGRAM  09/15/2023   Fecal DNA (Cologuard)  07/04/2025   DTaP/Tdap/Td (3 - Td or Tdap) 07/20/2026   Hepatitis C Screening  Completed   HIV Screening  Completed   HPV VACCINES  Aged Out   PAP SMEAR-Modifier  Discontinued    Discussed health benefits of physical activity, and encouraged her to engage in regular exercise appropriate for her age and condition.  Problem List Items Addressed This Visit       Other   Annual physical exam - Primary    Things to do to keep yourself healthy   - Exercise at least 30-45 minutes a day, 3-4 days a week.  - Eat a low-fat diet with lots of fruits and vegetables, up to 7-9 servings per day.  - Seatbelts can save your life. Wear them always.  - Smoke detectors on every level of your home, check batteries  every year.  - Eye Doctor - have an eye exam every 1-2 years  - Safe sex - if you may be exposed to STDs, use a condom.  - Alcohol -  If you drink, do it moderately, less than 2 drinks per day.  - Health Care Power of Attorney. Choose someone to speak for you if you are not able.  - Depression is common in our stressful world.If you're feeling down or losing interest in things you normally enjoy, please come in for a visit.  - Violence - If anyone is threatening or hurting you, please call immediately.       Former cigarette smoker    Due for LD CT Scan      Relevant Orders   Ambulatory Referral Lung Cancer Screening [facility]   GAD (generalized anxiety disorder)    Chronic; impaired sleep patterns and relaxation patters May be up for 5-7 days at a time Low dose benzo to assist Consider daily medication if more frequent episodes arise       Relevant Medications   ALPRAZolam (XANAX) 0.5 MG tablet   Hx of total hip arthroplasty, right    R hip 2024 Dr Ernest Pine      Mixed hyperlipidemia    Chronic; repeat LP The 10-year ASCVD risk score (Arnett DK, et al., 2019) is: 4.6% recommend diet low in saturated fat and regular exercise - 30 min at least 5 times per week       Relevant Orders   Lipid panel   Postmenopausal    Chronic; due for DEXA       Relevant Orders   DG Bone Density   Prediabetes    Chronic; unknown Repeat A1c Continue to recommend balanced, lower carb meals. Smaller meal size, adding snacks. Choosing water as drink of choice and increasing purposeful exercise.       Relevant Orders   Hemoglobin A1c   Other Visit Diagnoses     Encounter for screening mammogram for malignant neoplasm of  breast       Relevant Orders   MM 3D SCREENING MAMMOGRAM BILATERAL BREAST      Return in about 6 months (around 12/16/2023) for chonic disease management.    Leilani Merl, [name], have reviewed all documentation for this visit. The documentation on 06/15/23 for the exam, diagnosis, procedures, and orders are all accurate and complete.  [name], [name]  [facility] [phone] (phone) [phone] (fax)  [facility]

## 2023-06-15 NOTE — Assessment & Plan Note (Signed)
Chronic; unknown Repeat A1c Continue to recommend balanced, lower carb meals. Smaller meal size, adding snacks. Choosing water as drink of choice and increasing purposeful exercise.

## 2023-06-15 NOTE — Assessment & Plan Note (Signed)
Chronic; due for DEXA

## 2023-06-15 NOTE — Patient Instructions (Addendum)
Please call and schedule your mammogram and bone scan:  [facility]  [address], [address] [facility] [place],  [place]  [address] Get Driving Directions Main: [phone]  Sunday:Closed Monday:7:20 AM - 5:00 PM Tuesday:7:20 AM - 5:00 PM Wednesday:7:20 AM - 5:00 PM Thursday:7:20 AM - 5:00 PM Friday:7:20 AM - 4:30 PM Saturday:Closed  The CDC recommends two doses of Shingrix (the new shingles vaccine) separated by 2 to 6 months for adults age 70 years and older. I recommend checking with your insurance plan regarding coverage for this vaccine.    Plan to get pneumonia and flu vaccines this Fall

## 2023-06-15 NOTE — Assessment & Plan Note (Signed)
Due for LD CT Scan

## 2023-06-15 NOTE — Assessment & Plan Note (Signed)

## 2023-06-15 NOTE — Assessment & Plan Note (Signed)
R hip 2024 Dr Ernest Pine

## 2023-06-21 ENCOUNTER — Encounter: Payer: BC Managed Care – PPO | Admitting: Family Medicine

## 2023-06-28 DIAGNOSIS — E782 Mixed hyperlipidemia: Secondary | ICD-10-CM | POA: Diagnosis not present

## 2023-06-28 DIAGNOSIS — R7303 Prediabetes: Secondary | ICD-10-CM | POA: Diagnosis not present

## 2023-06-29 DIAGNOSIS — R7309 Other abnormal glucose: Secondary | ICD-10-CM | POA: Diagnosis not present

## 2023-07-30 DIAGNOSIS — R7309 Other abnormal glucose: Secondary | ICD-10-CM | POA: Diagnosis not present

## 2023-08-22 DIAGNOSIS — Z23 Encounter for immunization: Secondary | ICD-10-CM | POA: Diagnosis not present

## 2023-08-29 DIAGNOSIS — R7309 Other abnormal glucose: Secondary | ICD-10-CM | POA: Diagnosis not present

## 2023-08-31 ENCOUNTER — Ambulatory Visit
Admission: RE | Admit: 2023-08-31 | Discharge: 2023-08-31 | Disposition: A | Discharge status: Home / Self Care | Payer: Medicare Other | Source: Ambulatory Visit | Attending: Family Medicine | Admitting: Family Medicine

## 2023-08-31 DIAGNOSIS — Z1231 Encounter for screening mammogram for malignant neoplasm of breast: Secondary | ICD-10-CM | POA: Diagnosis present

## 2023-08-31 DIAGNOSIS — Z780 Asymptomatic menopausal state: Secondary | ICD-10-CM | POA: Insufficient documentation

## 2023-08-31 DIAGNOSIS — M8589 Other specified disorders of bone density and structure, multiple sites: Secondary | ICD-10-CM | POA: Diagnosis not present

## 2023-08-31 NOTE — Progress Notes (Signed)
Osteopenia, or decreased bone strength noted. Recommend use of a minimum of  Vit D 800 IU and Calcium 1200 mg to assist regular exercise for bone health. Can repeat DEXA in 3-5 years if desired.

## 2023-09-18 ENCOUNTER — Other Ambulatory Visit: Payer: Self-pay

## 2023-09-18 DIAGNOSIS — Z1211 Encounter for screening for malignant neoplasm of colon: Secondary | ICD-10-CM

## 2023-09-19 ENCOUNTER — Other Ambulatory Visit: Payer: Self-pay | Admitting: *Deleted

## 2023-09-19 DIAGNOSIS — Z1211 Encounter for screening for malignant neoplasm of colon: Secondary | ICD-10-CM

## 2023-09-19 NOTE — Progress Notes (Signed)
See colorectal screening flowsheet. Explained the FIT Test and answered questions. Gave FIT Test to patient to complete at home.

## 2023-09-28 LAB — FECAL OCCULT BLOOD, IMMUNOCHEMICAL: Fecal Occult Bld: NEGATIVE

## 2023-09-29 DIAGNOSIS — R7309 Other abnormal glucose: Secondary | ICD-10-CM | POA: Diagnosis not present

## 2023-10-30 ENCOUNTER — Other Ambulatory Visit: Payer: Self-pay

## 2023-10-30 ENCOUNTER — Ambulatory Visit (INDEPENDENT_AMBULATORY_CARE_PROVIDER_SITE_OTHER): Payer: Self-pay | Admitting: Physician Assistant

## 2023-10-30 ENCOUNTER — Encounter: Payer: Self-pay | Admitting: Physician Assistant

## 2023-10-30 VITALS — BP 130/80 | HR 70 | Temp 95.0°F | Ht 63.0 in | Wt 150.0 lb

## 2023-10-30 DIAGNOSIS — L03011 Cellulitis of right finger: Secondary | ICD-10-CM

## 2023-10-30 MED ORDER — DOXYCYCLINE HYCLATE 100 MG PO TABS
100.0000 mg | ORAL_TABLET | Freq: Two times a day (BID) | ORAL | 0 refills | Status: AC
Start: 2023-10-30 — End: 2023-11-09

## 2023-10-30 NOTE — Progress Notes (Signed)
[facility] [address] [place], [place] [address]   Office Visit Note  Patient Name: Bridget Pollard Date of Birth [id number]  Medical Record number [id number]  Date of Service: 10/30/2023  Chief Complaint  Patient presents with   Acute Visit    Right index finger swelling with redness. Patient was gardening last week without gloves and thinks a woody stock from an [employer] plant went into the nail bed of her finger. She has been unsuccessful at removing it.      67 y/o F presents to the clinic for c/o right index finger with swelling and redness x 5 days. Noticed pain after she was gardening without gloves. Thinks there maybe a wooden splinter in her finger, but not sure. Has been trying to open the area to see if there is a splinter in her finger. No success. However, she states her finger is better today than yesterday. Denies fever or chills.       Current Medication:  Outpatient Encounter Medications as of 10/30/2023  Medication Sig   ALPRAZolam (XANAX) 0.5 MG tablet Take 1 tablet (0.5 mg total) by mouth at bedtime as needed for anxiety or sleep.   Calcium Carb-Cholecalciferol (CALCIUM 500 + D PO) Take by mouth daily.   doxycycline (VIBRA-TABS) 100 MG tablet Take 1 tablet (100 mg total) by mouth 2 (two) times daily for 10 days.   [DISCONTINUED] methocarbamol (ROBAXIN-750) 750 MG tablet Take 1 tablet (750 mg total) by mouth in the morning and at bedtime.   No facility-administered encounter medications on file as of 10/30/2023.      Medical History: Past Medical History:  Diagnosis Date   Degenerative arthritis of hip 02/2023   right   Mixed hyperlipidemia    PONV (postoperative nausea and vomiting)      Vital Signs: BP 130/80   Pulse 70   Temp (!) 95 F (35 C)   Ht 5\' 3"  (1.6 m)   Wt 150 lb (68 kg)   SpO2 98%   BMI 26.57 kg/m    Review of Systems  Constitutional: Negative.   Musculoskeletal: Negative.   Skin:  Positive for color change and wound.     Physical Exam Constitutional:      Appearance: Normal appearance.  HENT:     Head: Normocephalic and atraumatic.     Right Ear: External ear normal.     Left Ear: External ear normal.  Eyes:     Extraocular Movements: Extraocular movements intact.  Skin:    General: Skin is warm and dry.     Comments: Right Index Finger - +swelling , redness, and TTP of the eponychium. Not actively bleeding. Unable to visualize or palpate underlying foreign object.   Neurological:     Mental Status: She is alert and oriented to person, place, and time.  Psychiatric:        Mood and Affect: Mood normal.        Behavior: Behavior normal.       Assessment/Plan:  1. Paronychia of right index finger - doxycycline (VIBRA-TABS) 100 MG tablet; Take 1 tablet (100 mg total) by mouth 2 (two) times daily for 10 days.  Dispense: 20 tablet; Refill: 0  Soaked right index finger for 10-15 mins in hydrogen peroxide and saline water. Cleansed the skin with  Providine swab and sterile sponge. Used a 23 gauze needle under magnifying glass and light to puncture a hole to relieve pressure and blood. No foreign body visualized. Applied  Bacitracin ointment and a knuckle band aid. Keep area clean, dry, and covered. Keep finger elevated whenever possible. Start medicine as prescribed. Continue to watch for worsening symptoms. RTC tomorrow for wound check.  General Counseling: Bridget Pollard understanding of the findings of todays visit and agrees with plan of treatment. I have discussed any further diagnostic evaluation that may be needed or ordered today. We also reviewed her medications today. she has been encouraged to call the office with any questions or concerns that should arise related to todays visit.    Time spent:30 Minutes    Gilberto Better, [name] Physician Assistant

## 2023-10-31 ENCOUNTER — Ambulatory Visit (INDEPENDENT_AMBULATORY_CARE_PROVIDER_SITE_OTHER): Payer: Self-pay | Admitting: Physician Assistant

## 2023-10-31 ENCOUNTER — Encounter: Payer: Self-pay | Admitting: Physician Assistant

## 2023-10-31 VITALS — HR 88 | Temp 97.8°F

## 2023-10-31 DIAGNOSIS — L03011 Cellulitis of right finger: Secondary | ICD-10-CM

## 2023-10-31 NOTE — Progress Notes (Signed)
[facility] [address] [place], [place] [address]   Office Visit Note  Patient Name: Bridget Pollard Date of Birth [id number]  Medical Record number [id number]  Date of Service: 10/31/2023  Chief Complaint  Patient presents with   Follow-up    Patient states her finger is feeling better but tender to the touch.     67 y/o F presents to the clinic for a f/u for paronychia of right index finger. Doing much better today than yesterday. Denies fever or chills.       Current Medication:  Outpatient Encounter Medications as of 10/31/2023  Medication Sig   ALPRAZolam (XANAX) 0.5 MG tablet Take 1 tablet (0.5 mg total) by mouth at bedtime as needed for anxiety or sleep.   Calcium Carb-Cholecalciferol (CALCIUM 500 + D PO) Take by mouth daily.   doxycycline (VIBRA-TABS) 100 MG tablet Take 1 tablet (100 mg total) by mouth 2 (two) times daily for 10 days.   No facility-administered encounter medications on file as of 10/31/2023.      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   Skin:  Negative for wound.    Physical Exam Musculoskeletal:     Comments: Right Index Finger: Less swelling and faint redness a the eponychium. +TTP. NO active bleeding. No red streaks. No purulent discharge.        Assessment/Plan:  1. Paronychia of right index finger  Keep area clean, dry, and covered for another 24 -48 hours. Apply antibiotic ointment with every dressing changes.  After tomorrow keep wound/infected area air dry at home and keep it covered at workplace. RTC if any difficulty arises. Continue and finish oral antibiotic as previously prescribed. Pt verbalized understanding and in agreement.   General Counseling: seniyah schrupp understanding of the findings of todays visit and agrees with plan of treatment. I have discussed any further diagnostic evaluation that may be needed or ordered today. We also reviewed her medications today. she has been encouraged to call the office with any questions or concerns that should arise related to todays visit.    Time spent:20 Minutes    Gilberto Better, [name] Physician Assistant

## 2024-01-01 ENCOUNTER — Ambulatory Visit (INDEPENDENT_AMBULATORY_CARE_PROVIDER_SITE_OTHER): Payer: Medicare Other | Admitting: Family Medicine

## 2024-01-01 VITALS — BP 115/66 | HR 72 | Temp 94.3°F | Resp 16 | Ht 63.0 in | Wt 158.3 lb

## 2024-01-01 DIAGNOSIS — E782 Mixed hyperlipidemia: Secondary | ICD-10-CM | POA: Diagnosis not present

## 2024-01-01 DIAGNOSIS — R7303 Prediabetes: Secondary | ICD-10-CM

## 2024-01-01 DIAGNOSIS — Z96641 Presence of right artificial hip joint: Secondary | ICD-10-CM

## 2024-01-01 NOTE — Assessment & Plan Note (Signed)
R total hip replaced May 2024 Pt interested in jogging again.  Discussed reaching out to ortho to confirm ok to jog Otherwise healed well and no concerns Continue current exercise regimen

## 2024-01-01 NOTE — Assessment & Plan Note (Signed)
Previous labs elevated Will recheck fasting LP Continue to decrease saturated fats and processed Food Continue use of flax seed, may take daily omega 3.   Will check ASCVD risk once lab results

## 2024-01-01 NOTE — Assessment & Plan Note (Signed)
Previous A1c slightly elevated at 5.7. -Order repeat A1c and fasting glucose. Increase smaller meal, increase protein intake, fruits, and veggies. Decrease processed foods, and saturated fats. Encouraged daily exercise as tolerated.

## 2024-01-01 NOTE — Progress Notes (Signed)
Established Patient Office Visit  Subjective   Patient ID: Bridget Pollard, female    DOB: Nov 14, 1956  Age: 68 y.o. MRN: [id number]  Chief Complaint  Patient presents with   Follow-up    Hip replacement in may but no current concerns  When could she move faster than just a walk    The patient is a 68 year old female who presents for general follow-up.  She has had her right hip replaced and has healed well from the surgery. She notes a 'divot' at the surgical site but it is not painful and the scar has healed. She is interested in knowing when she can start jogging again, as she has been cleared for all activities. She has been engaging in gym activities such as treadmill and biking but wishes to increase her activity level.   Her A1c was previously 5.7, indicating prediabetes, and elevated cholesterol and LDL. She has been incorporating ground flax into her diet, although she has not been consistent due to recent travel. She is interested in seeing if this dietary change has impacted her blood sugar levels.  She takes vitamin D and calcium daily due to a bone mass test indicating a 7% chance of fracture in the next ten years, suggestive of osteopenia.       01/01/2024   10:51 AM 06/15/2023    9:49 AM 12/02/2022    8:56 AM  Depression screen PHQ 2/9  Decreased Interest 0 0 0  Down, Depressed, Hopeless 0 0 0  PHQ - 2 Score 0 0 0  Altered sleeping 0 1 0  Tired, decreased energy 0 1 0  Change in appetite 0 0 0  Feeling bad or failure about yourself  0 0 0  Trouble concentrating 0 0 0  Moving slowly or fidgety/restless 0 0 0  Suicidal thoughts 0 0 0  PHQ-9 Score 0 2 0  Difficult doing work/chores Not difficult at all  Not difficult at all        No data to display           Review of Systems  All other systems reviewed and are negative.   Negative unless indicated in HPI   Objective:     BP 115/66 (BP Location: Left Arm, Patient Position: Sitting, Cuff Size: Normal)    Pulse 72   Temp (!) 94.3 F (34.6 C)   Resp 16   Ht 5\' 3"  (1.6 m)   Wt 158 lb 4.8 oz (71.8 kg)   SpO2 97%   BMI 28.04 kg/m    Physical Exam Constitutional:      General: She is not in acute distress.    Appearance: Normal appearance. She is normal weight. She is not ill-appearing.  HENT:     Head: Normocephalic.     Right Ear: Tympanic membrane normal.     Left Ear: Tympanic membrane normal.     Nose: Nose normal.     Mouth/Throat:     Mouth: Mucous membranes are moist.     Pharynx: Oropharynx is clear. No oropharyngeal exudate or posterior oropharyngeal erythema.  Eyes:     General: Lids are normal.     Extraocular Movements: Extraocular movements intact.     Right eye: Normal extraocular motion.     Left eye: Normal extraocular motion.     Conjunctiva/sclera: Conjunctivae normal.     Right eye: Right conjunctiva is not injected.     Left eye: Left conjunctiva is not injected.  Pupils: Pupils are equal, round, and reactive to light.  Neck:     Thyroid: No thyroid mass, thyromegaly or thyroid tenderness.  Cardiovascular:     Rate and Rhythm: Normal rate.     Pulses: Normal pulses.          Radial pulses are 2+ on the right side and 2+ on the left side.       Posterior tibial pulses are 2+ on the right side and 2+ on the left side.     Heart sounds: Normal heart sounds, S1 normal and S2 normal. No murmur heard.    No friction rub. No gallop.  Pulmonary:     Effort: Pulmonary effort is normal. No respiratory distress.     Breath sounds: Normal breath sounds. No stridor. No wheezing, rhonchi or rales.  Abdominal:     General: Bowel sounds are normal. There is no distension.     Palpations: Abdomen is soft. There is no mass.     Tenderness: There is no abdominal tenderness. There is no guarding or rebound.     Hernia: No hernia is present.  Musculoskeletal:        General: No swelling or tenderness. Normal range of motion.     Cervical back: Normal range of motion.  No rigidity.  Lymphadenopathy:     Cervical: No cervical adenopathy.     Right cervical: No superficial, deep or posterior cervical adenopathy.    Left cervical: No superficial, deep or posterior cervical adenopathy.  Skin:    General: Skin is warm and dry.     Capillary Refill: Capillary refill takes less than 2 seconds.     Findings: No bruising or erythema.  Neurological:     General: No focal deficit present.     Mental Status: She is alert and oriented to person, place, and time. Mental status is at baseline.     GCS: GCS eye subscore is 4. GCS verbal subscore is 5. GCS motor subscore is 6.     Cranial Nerves: No cranial nerve deficit.     Sensory: No sensory deficit.     Motor: No weakness, tremor or pronator drift.     Coordination: Romberg sign negative.     Gait: Gait is intact. Gait normal.  Psychiatric:        Attention and Perception: Attention and perception normal.        Mood and Affect: Mood and affect normal.        Speech: Speech normal.        Behavior: Behavior normal. Behavior is cooperative.        Thought Content: Thought content normal.        Cognition and Memory: Cognition and memory normal.        Judgment: Judgment normal.     No results found for any visits on 01/01/24.    The 10-year ASCVD risk score (Arnett DK, et al., 2019) is: 5.7%    Assessment & Plan:  Prediabetes Assessment & Plan: Previous A1c slightly elevated at 5.7. -Order repeat A1c and fasting glucose. Increase smaller meal, increase protein intake, fruits, and veggies. Decrease processed foods, and saturated fats. Encouraged daily exercise as tolerated.  Orders: -     Hemoglobin A1c  Mixed hyperlipidemia Assessment & Plan: Previous labs elevated Will recheck fasting LP Continue to decrease saturated fats and processed Food Continue use of flax seed, may take daily omega 3.   Will check ASCVD risk once lab results  Orders: -  Lipid panel -     Comprehensive  metabolic panel  Hx of total hip arthroplasty, right Assessment & Plan: R total hip replaced May 2024 Pt interested in jogging again.  Discussed reaching out to ortho to confirm ok to jog Otherwise healed well and no concerns Continue current exercise regimen    Will communicate lab results.  Return for annual physical.   I, Sallee Provencal, [name], have reviewed all documentation for this visit. The documentation on 01/01/24 for the exam, diagnosis, procedures, and orders are all accurate and complete.   Sallee Provencal, [name]

## 2024-01-04 ENCOUNTER — Encounter: Payer: Self-pay | Admitting: Family Medicine

## 2024-01-04 LAB — LIPID PANEL
Chol/HDL Ratio: 4.7 {ratio} — ABNORMAL HIGH (ref 0.0–4.4)
Cholesterol, Total: 215 mg/dL — ABNORMAL HIGH (ref 100–199)
HDL: 46 mg/dL (ref >39)
LDL Chol Calc (NIH): 147 mg/dL — ABNORMAL HIGH (ref 0–99)
Triglycerides: 121 mg/dL (ref 0–149)
VLDL Cholesterol Cal: 22 mg/dL (ref 5–40)

## 2024-01-04 LAB — COMPREHENSIVE METABOLIC PANEL
ALT: 23 [IU]/L (ref 0–32)
AST: 25 [IU]/L (ref 0–40)
Albumin: 4.7 g/dL (ref 3.9–4.9)
Alkaline Phosphatase: 109 [IU]/L (ref 44–121)
BUN/Creatinine Ratio: 15 (ref 12–28)
BUN: 13 mg/dL (ref 8–27)
Bilirubin Total: 0.4 mg/dL (ref 0.0–1.2)
CO2: 22 mmol/L (ref 20–29)
Calcium: 9.4 mg/dL (ref 8.7–10.3)
Chloride: 105 mmol/L (ref 96–106)
Creatinine, Ser: 0.84 mg/dL (ref 0.57–1.00)
Globulin, Total: 2.2 g/dL (ref 1.5–4.5)
Glucose: 94 mg/dL (ref 70–99)
Potassium: 5 mmol/L (ref 3.5–5.2)
Sodium: 145 mmol/L — ABNORMAL HIGH (ref 134–144)
Total Protein: 6.9 g/dL (ref 6.0–8.5)
eGFR: 77 mL/min/{1.73_m2} (ref >59)

## 2024-01-04 LAB — HEMOGLOBIN A1C
Est. average glucose Bld gHb Est-mCnc: 128 mg/dL
Hgb A1c MFr Bld: 6.1 % — ABNORMAL HIGH (ref 4.8–5.6)

## 2024-02-06 ENCOUNTER — Ambulatory Visit

## 2024-02-06 VITALS — Ht 63.0 in | Wt 158.0 lb

## 2024-02-06 DIAGNOSIS — Z0000 Encounter for general adult medical examination without abnormal findings: Secondary | ICD-10-CM

## 2024-02-06 NOTE — Patient Instructions (Addendum)
 Ms. Bridget Pollard , Thank you for taking time to come for your Medicare Wellness Visit. I appreciate your ongoing commitment to your health goals. Please review the following plan we discussed and let me know if I can assist you in the future.   Referrals/Orders/Follow-Ups/Clinician Recommendations: Yes; Keep maintaining your health by keeping your appointments with Charlcie Cradle, NP and any specialists that you may see.  Call us if you need anything.  Have a great year!!!!  This is a list of the screening recommended for you and due dates:  Health Maintenance  Topic Date Due   Zoster (Shingles) Vaccine (1 of 2) Never done   Pneumonia Vaccine (1 of 1 - PCV) Never done   Flu Shot  06/29/2023   COVID-19 Vaccine (8 - 2024-25 season) 10/17/2023   Medicare Annual Wellness Visit  02/05/2025   Cologuard (Stool DNA test)  07/04/2025   Mammogram  08/30/2025   DTaP/Tdap/Td vaccine (3 - Td or Tdap) 07/20/2026   DEXA scan (bone density measurement)  Completed   Hepatitis C Screening  Completed   HPV Vaccine  Aged Out    Advanced directives: (Declined) Advance directive discussed with you today. Even though you declined this today, please call our office should you change your mind, and we can give you the proper paperwork for you to fill out.  Next Medicare Annual Wellness Visit scheduled for next year: Yes

## 2024-02-06 NOTE — Progress Notes (Addendum)
 Subjective:   Bridget Pollard is a 68 y.o. who presents for a Medicare Wellness preventive visit.  Visit Complete: Virtual I connected with  [name] on 02/06/24 by a audio enabled telemedicine application and verified that I am speaking with the correct person using two identifiers.  Patient Location: Home  Provider Location: Office/Clinic  I discussed the limitations of evaluation and management by telemedicine. The patient expressed understanding and agreed to proceed.  Vital Signs: Because this visit was a virtual/telehealth visit, some criteria may be missing or patient reported. Any vitals not documented were not able to be obtained and vitals that have been documented are patient reported.  VideoDeclined- This patient declined Librarian, academic. Therefore the visit was completed with audio only.  AWV Questionnaire: No: Patient Medicare AWV questionnaire was not completed prior to this visit.  Cardiac Risk Factors include: advanced age (>8men, >41 women);dyslipidemia;family history of premature cardiovascular disease     Objective:    Today's Vitals   02/06/24 1204  Weight: 158 lb (71.7 kg)  Height: 5\' 3"  (1.6 m)  PainSc: 0-No pain   Body mass index is 27.99 kg/m.     02/06/2024   12:06 PM 04/12/2023    1:00 PM 04/05/2023    8:40 AM 07/15/2016    3:35 PM 01/06/2016   11:10 AM 09/29/2015    2:57 PM  Advanced Directives  Does Patient Have a Medical Advance Directive? No No No No No No  Would patient like information on creating a medical advance directive? No - Patient declined No - Patient declined        Current Medications (verified) Outpatient Encounter Medications as of 02/06/2024  Medication Sig   Calcium Carb-Cholecalciferol (CALCIUM 500 + D PO) Take by mouth daily.   No facility-administered encounter medications on file as of 02/06/2024.    Allergies (verified) Penicillins   History: Past Medical History:  Diagnosis Date    Degenerative arthritis of hip 02/2023   right   Mixed hyperlipidemia    PONV (postoperative nausea and vomiting)    Past Surgical History:  Procedure Laterality Date   COSMETIC SURGERY     Neck Lift   MOUTH SURGERY     bridge   SHOULDER SURGERY Left 11/29/1979   AC joint   TOTAL HIP ARTHROPLASTY Right 04/12/2023   Procedure: TOTAL HIP ARTHROPLASTY;  Surgeon: Donato Heinz, MD;  Location: [facility] ORS;  Service: Orthopedics;  Laterality: Right;   Family History  Problem Relation Age of Onset   COPD Mother    Stroke Father    Prostate cancer Father    Heart attack Father    Skin cancer Father    Hypertension Sister    Skin cancer Sister    Diabetes Brother    Heart disease Brother    Social History   Socioeconomic History   Marital status: Married    Spouse name: Loraine Leriche   Number of children: 0   Years of education: PhD   Highest education level: Doctorate  Occupational History   Occupation: Full Time Teacher  Tobacco Use   Smoking status: Former    Current packs/day: 0.00    Types: Cigarettes    Quit date: 11/28/1979    Years since quitting: 44.2   Smokeless tobacco: Never  Vaping Use   Vaping status: Never Used  Substance and Sexual Activity   Alcohol use: Yes    Alcohol/week: 2.0 standard drinks of alcohol    Types: 2  Glasses of wine per week    Comment: occasional   Drug use: No   Sexual activity: Not on file  Other Topics Concern   Not on file  Social History Narrative   Not on file   Social Drivers of Health   Financial Resource Strain: Low Risk  (02/06/2024)   Overall Financial Resource Strain (CARDIA)    Difficulty of Paying Living Expenses: Not hard at all  Food Insecurity: No Food Insecurity (02/06/2024)   Hunger Vital Sign    Worried About Running Out of Food in the Last Year: Never true    Ran Out of Food in the Last Year: Never true  Transportation Needs: No Transportation Needs (02/06/2024)   PRAPARE - Scientist, research (physical sciences) (Medical): No    Lack of Transportation (Non-Medical): No  Physical Activity: Sufficiently Active (02/06/2024)   Exercise Vital Sign    Days of Exercise per Week: 5 days    Minutes of Exercise per Session: 30 min  Recent Concern: Physical Activity - Insufficiently Active (01/01/2024)   Exercise Vital Sign    Days of Exercise per Week: 2 days    Minutes of Exercise per Session: 30 min  Stress: No Stress Concern Present (02/06/2024)   [employer] of Occupational Health - Occupational Stress Questionnaire    Feeling of Stress : Only a little  Social Connections: Moderately Isolated (02/06/2024)   Social Connection and Isolation Panel [NHANES]    Frequency of Communication with Friends and Family: More than three times a week    Frequency of Social Gatherings with Friends and Family: Once a week    Attends Religious Services: Never    Database administrator or Organizations: No    Attends Engineer, structural: Not on file    Marital Status: Married    Tobacco Counseling Counseling given: Not Answered    Clinical Intake:  Pre-visit preparation completed: Yes  Pain : No/denies pain Pain Score: 0-No pain     Nutritional Risks: None Diabetes: No  How often do you need to have someone help you when you read instructions, pamphlets, or other written materials from your doctor or pharmacy?: 1 - Never What is the last grade level you completed in school?: PH.D  Interpreter Needed?: No  Information entered by :: Shakiera Edelson N. Charmagne Buhl, LPN.   Activities of Daily Living     02/06/2024   12:08 PM 06/15/2023    9:48 AM  In your present state of health, do you have any difficulty performing the following activities:  Hearing? 0 0  Vision? 0 0  Difficulty concentrating or making decisions? 0 0  Comment BSE: reading & puzzles; professor   Walking or climbing stairs? 0 1  Dressing or bathing? 0 0  Doing errands, shopping? 0 0  Preparing Food and eating ? N    Using the Toilet? N   In the past six months, have you accidently leaked urine? N   Do you have problems with loss of bowel control? N   Managing your Medications? N   Managing your Finances? N   Housekeeping or managing your Housekeeping? N     Patient Care Team: Sallee Provencal, [name] as PCP - General (Family Medicine)  Indicate any recent Medical Services you may have received from other than [facility] providers in the past year (date may be approximate).     Assessment:   This is a routine wellness examination for Jazmina.  Hearing/Vision screen  Hearing Screening - Comments:: Denies hearing difficulties. No hearing aids.   Vision Screening - Comments:: Wears reading glasses - not up to date with routine eye exams with [facility]    Goals Addressed             This Visit's Progress    My healthcare goals for 2025 is to stay active and lose 10 pounds.         Depression Screen     02/06/2024   12:10 PM 01/01/2024   10:51 AM 06/15/2023    9:49 AM 12/02/2022    8:56 AM 06/22/2022    8:38 AM  PHQ 2/9 Scores  PHQ - 2 Score 0 0 0 0 1  PHQ- 9 Score 0 0 2 0 5    Fall Risk     02/06/2024   12:07 PM 06/15/2023    9:48 AM 12/02/2022    8:56 AM 06/22/2022    8:37 AM  Fall Risk   Falls in the past year? 0 0 0 1  Number falls in past yr: 0 0 0 0  Injury with Fall? 0 0 0 0  Risk for fall due to : No Fall Risks No Fall Risks No Fall Risks No Fall Risks  Follow up Falls prevention discussed;Falls evaluation completed Falls evaluation completed  Falls evaluation completed    MEDICARE RISK AT HOME:  Medicare Risk at Home Any stairs in or around the home?: Yes If so, are there any without handrails?: No Home free of loose throw rugs in walkways, pet beds, electrical cords, etc?: Yes Adequate lighting in your home to reduce risk of falls?: Yes Life alert?: No Use of a cane, walker or w/c?: No Grab bars in the bathroom?: No (very careful) Shower chair or bench in shower?:  Yes Elevated toilet seat or a handicapped toilet?: Yes  TIMED UP AND GO:  Was the test performed?  No  Cognitive Function: 6CIT completed    02/06/2024   12:09 PM  MMSE - Mini Mental State Exam  Not completed: Unable to complete        02/06/2024   12:08 PM  6CIT Screen  What Year? 0 points  What month? 0 points  What time? 0 points  Count back from 20 0 points  Months in reverse 0 points  Repeat phrase 0 points  Total Score 0 points    Immunizations Immunization History  Administered Date(s) Administered   Hepatitis A 03/25/2015, 01/21/2016   Hepatitis B 03/25/2015, 01/21/2016, 11/17/2017   Hepatitis B, ADULT 11/17/2017   Influenza,inj,Quad PF,6+ Mos 08/07/2019   Influenza-Unspecified 09/19/2017, 09/18/2018, 08/05/2020, 12/30/2022   MMR 09/24/2018   Moderna Covid-19 Vaccine Bivalent Booster 48yrs & up 01/25/2023, 08/22/2023   Moderna Sars-Covid-2 Vaccination 01/28/2020, 02/25/2020, 09/24/2020, 06/19/2021, 10/25/2021   Td 12/16/2005   Tdap 07/20/2016   Typhoid Live 04/13/2015    Screening Tests Health Maintenance  Topic Date Due   Zoster Vaccines- Shingrix (1 of 2) Never done   Pneumonia Vaccine 15+ Years old (1 of 1 - PCV) Never done   INFLUENZA VACCINE  06/29/2023   COVID-19 Vaccine (8 - 2024-25 season) 10/17/2023   Medicare Annual Wellness (AWV)  02/05/2025   Fecal DNA (Cologuard)  07/04/2025   MAMMOGRAM  08/30/2025   DTaP/Tdap/Td (3 - Td or Tdap) 07/20/2026   DEXA SCAN  Completed   Hepatitis C Screening  Completed   HPV VACCINES  Aged Out    Health Maintenance  Health Maintenance Due  Topic Date Due   Zoster Vaccines- Shingrix (1 of 2) Never done   Pneumonia Vaccine 58+ Years old (1 of 1 - PCV) Never done   INFLUENZA VACCINE  06/29/2023   COVID-19 Vaccine (8 - 2024-25 season) 10/17/2023   Health Maintenance Items Addressed: Yes  Additional Screening:  Vision Screening: Recommended annual ophthalmology exams for early detection of glaucoma  and other disorders of the eye.  Dental Screening: Recommended annual dental exams for proper oral hygiene  Community Resource Referral / Chronic Care Management: CRR required this visit?  No   CCM required this visit?  No     Plan:     I have personally reviewed and noted the following in the patient's chart:   Medical and social history Use of alcohol, tobacco or illicit drugs  Current medications and supplements including opioid prescriptions. Patient is not currently taking opioid prescriptions. Functional ability and status Nutritional status Physical activity Advanced directives List of other physicians Hospitalizations, surgeries, and ER visits in previous 12 months Vitals Screenings to include cognitive, depression, and falls Referrals and appointments  In addition, I have reviewed and discussed with patient certain preventive protocols, quality metrics, and best practice recommendations. A written personalized care plan for preventive services as well as general preventive health recommendations were provided to patient.     Mickeal Needy, LPN   6/96/2952   After Visit Summary: (MyChart) Due to this being a telephonic visit, the after visit summary with patients personalized plan was offered to patient via MyChart   Notes: Please refer to Routing Comments.

## 2024-08-14 ENCOUNTER — Ambulatory Visit

## 2024-08-14 NOTE — Progress Notes (Signed)
 Nutrition: 08/14/2024  CC: With aging, I have had a number of metabolic changes and need information on eating well and moving more.  Assessment: HT:5' 3   WT:153 lbs      BMI: 27.1  (overweight) Trying to stay physically active.  Walking on campus. Gardening 2-3 hours on weekends.  Gardening daily during the week.  Doing the gym 3 days per week.  Walking as much as possible. Tries to do a vegetarian diet and include cheese and fish. Has seen weight gain with menopause. Had a hip replacement 1.5 years ago.  Has done well.  Found great deal of pain relief. Has failed to return to level of physical activity she was doing prior to her hip pain and replacement.  Diet Recall: 6:00 am Breakfast: [place] yogurt, 2-3 prunes or small serving of strawberries or other fruit,   3 walnuts, ground flax seed meal added to yogurt.  Drinks water 8:00-10:00: Sips on a large cup of black coffee containing caffeine.  Lunch usually carrots, peppers, almonds, cheese (Baby Bell) and multi grain crackers.  Yusif Gnau well graze on her lunch during late morning until 12:00. +/- afternoon snack.  Devondre Guzzetta sometimes have a protein bar or popcorn. 6:00 Dinner: Iam Lipson have a Chipotle salad. Will split up into 3 nights.  Will add som [employer] Burger meat substitute to each meal.  Will have fish and vegetables 1-2 times per week. Frequently will have a salad and baked sweet potato.  On Friday evening will have a glass of wine with dinner. Generally drinks only water. Tries to not eat past 7:00 pm.  Recommendations: Continue to use greek yogurt with added fruit.  Add a few more nuts. Make sure your grain are whole grain.  Look for fiber and protein in crackers and breads. Keep using the flax seeds. Be as active as possible.  Consider water exercise, swimming to increase muscle strength. Keep biking. Aim for 60+ grams of protein per day.  Teaching Materials: Mediterranean Diet Handout Food and Food Groups handout. Food Label  handout  Follow-up: As desired  Maggie Shanikia Kernodle, RN, RD, LDN

## 2024-10-25 ENCOUNTER — Telehealth: Payer: Self-pay

## 2025-01-03 ENCOUNTER — Encounter

## 2025-02-11 ENCOUNTER — Ambulatory Visit

## 2025-03-12 ENCOUNTER — Encounter
